# Patient Record
Sex: Male | Born: 1935 | Race: Black or African American | Hispanic: No | Marital: Married | Smoking: Current every day smoker
Health system: Southern US, Community
[De-identification: ages and names within clinical notes are randomized; demographics above are authoritative.]

## PROBLEM LIST (undated history)

## (undated) DIAGNOSIS — I509 Heart failure, unspecified: Secondary | ICD-10-CM

## (undated) DIAGNOSIS — D369 Benign neoplasm, unspecified site: Secondary | ICD-10-CM

## (undated) DIAGNOSIS — I10 Essential (primary) hypertension: Secondary | ICD-10-CM

## (undated) DIAGNOSIS — N529 Male erectile dysfunction, unspecified: Secondary | ICD-10-CM

## (undated) DIAGNOSIS — J449 Chronic obstructive pulmonary disease, unspecified: Secondary | ICD-10-CM

## (undated) DIAGNOSIS — M7051 Other bursitis of knee, right knee: Secondary | ICD-10-CM

## (undated) DIAGNOSIS — J309 Allergic rhinitis, unspecified: Secondary | ICD-10-CM

## (undated) DIAGNOSIS — N400 Enlarged prostate without lower urinary tract symptoms: Secondary | ICD-10-CM

## (undated) DIAGNOSIS — N1832 Chronic kidney disease, stage 3b: Secondary | ICD-10-CM

## (undated) DIAGNOSIS — I5022 Chronic systolic (congestive) heart failure: Secondary | ICD-10-CM

## (undated) DIAGNOSIS — N189 Chronic kidney disease, unspecified: Secondary | ICD-10-CM

## (undated) DIAGNOSIS — I4729 Other ventricular tachycardia: Secondary | ICD-10-CM

## (undated) DIAGNOSIS — K635 Polyp of colon: Secondary | ICD-10-CM

## (undated) DIAGNOSIS — I493 Ventricular premature depolarization: Secondary | ICD-10-CM

## (undated) DIAGNOSIS — E785 Hyperlipidemia, unspecified: Secondary | ICD-10-CM

## (undated) DIAGNOSIS — R0600 Dyspnea, unspecified: Secondary | ICD-10-CM

## (undated) DIAGNOSIS — E119 Type 2 diabetes mellitus without complications: Secondary | ICD-10-CM

## (undated) HISTORY — DX: Chronic kidney disease, unspecified: N18.9

## (undated) HISTORY — PX: INGUINAL HERNIA REPAIR: SUR1180

## (undated) HISTORY — DX: Essential (primary) hypertension: I10

## (undated) HISTORY — DX: Heart failure, unspecified: I50.9

## (undated) HISTORY — DX: Benign prostatic hyperplasia without lower urinary tract symptoms: N40.0

## (undated) HISTORY — DX: Allergic rhinitis, unspecified: J30.9

## (undated) HISTORY — DX: Hyperlipidemia, unspecified: E78.5

## (undated) HISTORY — DX: Chronic kidney disease, stage 3b: N18.32

## (undated) HISTORY — DX: Type 2 diabetes mellitus without complications: E11.9

## (undated) HISTORY — DX: Male erectile dysfunction, unspecified: N52.9

## (undated) HISTORY — DX: Polyp of colon: K63.5

## (undated) HISTORY — DX: Chronic systolic (congestive) heart failure: I50.22

## (undated) HISTORY — DX: Benign neoplasm, unspecified site: D36.9

## (undated) HISTORY — DX: Ventricular premature depolarization: I49.3

## (undated) HISTORY — DX: Other bursitis of knee, right knee: M70.51

## (undated) HISTORY — DX: Chronic obstructive pulmonary disease, unspecified: J44.9

## (undated) HISTORY — DX: Other ventricular tachycardia: I47.29

---

## 2010-12-27 HISTORY — PX: COLONOSCOPY: SHX174

## 2011-09-24 ENCOUNTER — Other Ambulatory Visit (HOSPITAL_COMMUNITY): Payer: Self-pay | Admitting: Internal Medicine

## 2011-09-24 DIAGNOSIS — J449 Chronic obstructive pulmonary disease, unspecified: Secondary | ICD-10-CM

## 2011-10-01 ENCOUNTER — Ambulatory Visit (HOSPITAL_COMMUNITY)
Admission: RE | Admit: 2011-10-01 | Discharge: 2011-10-01 | Disposition: A | Discharge status: Home / Self Care | Payer: Medicare Other | Source: Ambulatory Visit | Attending: Internal Medicine | Admitting: Internal Medicine

## 2011-10-01 DIAGNOSIS — J4489 Other specified chronic obstructive pulmonary disease: Secondary | ICD-10-CM | POA: Insufficient documentation

## 2011-10-01 DIAGNOSIS — J449 Chronic obstructive pulmonary disease, unspecified: Secondary | ICD-10-CM | POA: Insufficient documentation

## 2011-10-01 MED ORDER — ALBUTEROL SULFATE (5 MG/ML) 0.5% IN NEBU
2.5000 mg | INHALATION_SOLUTION | Freq: Once | RESPIRATORY_TRACT | Status: AC
  Administered 2011-10-01: 2.5 mg via RESPIRATORY_TRACT

## 2017-08-30 ENCOUNTER — Encounter: Payer: Self-pay | Admitting: Interventional Cardiology

## 2017-08-30 ENCOUNTER — Other Ambulatory Visit: Payer: Self-pay | Admitting: Internal Medicine

## 2017-08-30 ENCOUNTER — Ambulatory Visit
Admission: RE | Admit: 2017-08-30 | Discharge: 2017-08-30 | Disposition: A | Discharge status: Home / Self Care | Payer: Medicare Other | Source: Ambulatory Visit | Attending: Internal Medicine | Admitting: Internal Medicine

## 2017-08-30 DIAGNOSIS — R05 Cough: Secondary | ICD-10-CM

## 2017-08-30 DIAGNOSIS — R059 Cough, unspecified: Secondary | ICD-10-CM

## 2017-08-31 ENCOUNTER — Telehealth: Payer: Self-pay

## 2017-08-31 NOTE — Telephone Encounter (Signed)
SENT REFERRAL TO SCHEDULING 

## 2017-10-25 ENCOUNTER — Encounter: Payer: Self-pay | Admitting: Interventional Cardiology

## 2017-11-02 DIAGNOSIS — I493 Ventricular premature depolarization: Secondary | ICD-10-CM | POA: Insufficient documentation

## 2017-11-02 DIAGNOSIS — I4510 Unspecified right bundle-branch block: Secondary | ICD-10-CM | POA: Insufficient documentation

## 2017-11-02 DIAGNOSIS — R0602 Shortness of breath: Secondary | ICD-10-CM | POA: Insufficient documentation

## 2017-11-02 NOTE — Progress Notes (Signed)
Cardiology Office Note    Date:  11/03/2017   ID:  Gary Morse, DOB 1934-05-13, MRN [id number]  PCP:  [name], MD  Cardiologist: Sinclair Grooms, MD   Chief Complaint  Patient presents with  . Advice Only    Irregular heartbeat  . Shortness of Breath    History of Present Illness:  Gary Morse is a 83 y.o. male who has a prior history of diastolic heart failure initially noted in 1994, hypertension, and referred now due to irregular heartbeat by Dr. Benita Stabile.  Gary Morse states he has noted episodes of orthopnea in late winter/early spring.  He believes he had the flu.  He can now lie down without significant shortness of breath but does have dyspnea on exertion.  He denies chest pain.  He recently saw Dr. Deforest Hoyles who noted significant irregularity in heart rhythm.  An EKG was performed and revealed PACs as well as PVCs.  He also was noted to have a right bundle branch block.  No old tracings are available to determine chronicity of right bundle branch block.  Past Medical History:  Diagnosis Date  . Adenomatous polyp    POSITIVE, COLON 7/18, COLOGAURD COLON NEGATIVE  . Allergic rhinitis   . BPH (benign prostatic hyperplasia)    MICROSCOPIC HEMATURIA WORKUP IN 2004 WITH UROLOGY NEGATIVE, DR. Karsten Ro  . CHF (congestive heart failure) ([place])    EPISODE IN 1994 SECONDARY TO HYPERTENSION, ECHO 2005 NORMAL LV FUNCTION  . CKD (chronic kidney disease)    STAGE 2 MICROALBUMIN POSITIVE  . Colon polyp    COLON IN 2018  . COPD (chronic obstructive pulmonary disease) ([place])    ON X-RAY, CIGAR USE--PFT IN 2013 NORMAL  . Diabetes type 2, controlled ([place])   . Dyslipidemia   . ED (erectile dysfunction)   . Hypertension   . Patellar bursitis of right knee    PRE-PATELLA BURSITIS WITH INTERMITTENT SWELLING     Past Surgical History:  Procedure Laterality Date  . COLONOSCOPY  12/2010   10/2016  . INGUINAL HERNIA REPAIR Right    1976    Current  Medications: Outpatient Medications Prior to Visit  Medication Sig Dispense Refill  . aspirin EC 81 MG tablet Take 81 mg by mouth daily.    . fluticasone (FLONASE) 50 MCG/ACT nasal spray Place 1 spray into both nostrils daily.    Marland Kitchen glimepiride (AMARYL) 1 MG tablet Take 1 mg by mouth daily with breakfast.    . losartan-hydrochlorothiazide (HYZAAR) 100-12.5 MG tablet Take 1 tablet by mouth daily.    Marland Kitchen NIFEdipine (ADALAT CC) 90 MG 24 hr tablet Take 90 mg by mouth daily.     No facility-administered medications prior to visit.      Allergies:   Metformin and related and Penicillins   Social History   Socioeconomic History  . Marital status: Married    Spouse name: Not on file  . Number of children: 7  . Years of education: Not on file  . Highest education level: Not on file  Occupational History  . Occupation: CARPENTER  Social Needs  . Financial resource strain: Not on file  . Food insecurity:    Worry: Not on file    Inability: Not on file  . Transportation needs:    Medical: Not on file    Non-medical: Not on file  Tobacco Use  . Smoking status: Current Every Day Smoker    Types: Cigars  . Smokeless tobacco: Never Used  Substance and Sexual Activity  . Alcohol use: Yes  . Drug use: Never  . Sexual activity: Not on file  Lifestyle  . Physical activity:    Days per week: Not on file    Minutes per session: Not on file  . Stress: Not on file  Relationships  . Social connections:    Talks on phone: Not on file    Gets together: Not on file    Attends religious service: Not on file    Active member of club or organization: Not on file    Attends meetings of clubs or organizations: Not on file    Relationship status: Not on file  Other Topics Concern  . Not on file  Social History Narrative  . Not on file     Family History:  The patient's family history includes CAD in his father; Congestive Heart Failure in his sister; Congestive Heart Failure (age of onset: 58)  in his mother; Heart attack (age of onset: 18) in his father; Heart failure in his sister; Kidney failure in his brother; Other in his brother; Throat cancer in his brother.   ROS:   Please see the history of present illness.    No episodes of syncope.  Denies chest pain.  No edema. All other systems reviewed and are negative.   PHYSICAL EXAM:   VS:  BP 110/74   Pulse (!) 42   Ht 5\' 8"  (1.727 m)   Wt 196 lb 12.8 oz (89.3 kg)   BMI 29.92 kg/m    GEN: Well nourished, well developed, in no acute distress  HEENT: normal  Neck: no JVD, carotid bruits, or masses Cardiac:Irregular RRR; no murmurs, rubs, or gallops,no edema  Respiratory:  clear to auscultation bilaterally, normal work of breathing GI: soft, nontender, nondistended, + BS MS: no deformity or atrophy  Skin: warm and dry, no rash Neuro:  Alert and Oriented x 3, Strength and sensation are intact Psych: euthymic mood, full affect  Wt Readings from Last 3 Encounters:  11/03/17 196 lb 12.8 oz (89.3 kg)      Studies/Labs Reviewed:   EKG:  EKG May 9 tracing performed by Dr. Deforest Hoyles demonstrates sinus rhythm, PACs, PVC, prominent voltage, left ventricular hypertrophy, and right bundle branch block.  PR interval is also prolonged at 218 seconds.  Recent Labs: No results found for requested labs within last 8760 hours.   Lipid Panel No results found for: CHOL, TRIG, HDL, CHOLHDL, VLDL, LDLCALC, LDLDIRECT  Additional studies/ records that were reviewed today include:  No old cardiac data available.    ASSESSMENT:    1. Chronic diastolic heart failure ([place])   2. Intermittent palpitations   3. RBBB   4. PVC (premature ventricular contraction)   5. SOB (shortness of breath)      PLAN:  In order of problems listed above:  1. Prior history of combined systolic and diastolic heart failure treated with strong antihypertensive regimen with improvement.  Current status unknown but orthopnea earlier this year could have  been related to pulmonary congestion since he had predominantly orthopnea. 2. On auscultation heart rhythm is significantly irregular and relatively fast.  We need to exclude the possibility of atrial fibrillation and also quantitate the burden of premature ventricular contractions. 3. Documented and duration is unknown. 4. Will quantitate the density of premature beats and also exclude atrial fibrillation. 5. Could be a combination of chronic heart failure and COPD.  Still smokes cigars.  States he does not inhale.  Shortness of breath could be pulmonary.  Plan is to perform 2D Doppler echocardiogram to assess LV size and function.  We will also perform a 24-hour Holter monitor to exclude atrial fibrillation and quantitate PVC burden.  Return in 4 to 8 weeks for clinical follow-up.  Encourage patient to stop smoking.  Medication Adjustments/Labs and Tests Ordered: Current medicines are reviewed at length with the patient today.  Concerns regarding medicines are outlined above.  Medication changes, Labs and Tests ordered today are listed in the Patient Instructions below. Patient Instructions  Medication Instructions:  Your physician recommends that you continue on your current medications as directed. Please refer to the Current Medication list given to you today.   Labwork: None   Testing/Procedures: Your physician has recommended that you wear a 24 hour holter monitor. Holter monitors are medical devices that record the heart's electrical activity. Doctors most often use these monitors to diagnose arrhythmias. Arrhythmias are problems with the speed or rhythm of the heartbeat. The monitor is a small, portable device. You can wear one while you do your normal daily activities. This is usually used to diagnose what is causing palpitations/syncope (passing out).  Your physician has requested that you have an echocardiogram. Echocardiography is a painless test that uses sound waves to create  images of your heart. It provides your doctor with information about the size and shape of your heart and how well your heart's chambers and valves are working. This procedure takes approximately one hour. There are no restrictions for this procedure.    Follow-Up: Your physician recommends that you schedule a follow-up appointment in 4 to 6 weeks with Dr. Tamala Julian. (Can have 8/16 at 2:40P)   Any Other Special Instructions Will Be Listed Below (If Applicable).     If you need a refill on your cardiac medications before your next appointment, please call your pharmacy.      Signed, Sinclair Grooms, MD  11/03/2017 11:19 AM    [facility] [address], [place], [place]  [address] Phone: [phone]; Fax: [phone]

## 2017-11-03 ENCOUNTER — Encounter: Payer: Self-pay | Admitting: Interventional Cardiology

## 2017-11-03 ENCOUNTER — Ambulatory Visit: Payer: Medicare Other | Admitting: Interventional Cardiology

## 2017-11-03 VITALS — BP 110/74 | HR 42 | Ht 68.0 in | Wt 196.8 lb

## 2017-11-03 DIAGNOSIS — R002 Palpitations: Secondary | ICD-10-CM

## 2017-11-03 DIAGNOSIS — R0602 Shortness of breath: Secondary | ICD-10-CM | POA: Diagnosis not present

## 2017-11-03 DIAGNOSIS — I4510 Unspecified right bundle-branch block: Secondary | ICD-10-CM

## 2017-11-03 DIAGNOSIS — I493 Ventricular premature depolarization: Secondary | ICD-10-CM | POA: Diagnosis not present

## 2017-11-03 DIAGNOSIS — I5032 Chronic diastolic (congestive) heart failure: Secondary | ICD-10-CM

## 2017-11-03 NOTE — Patient Instructions (Addendum)
Medication Instructions:  Your physician recommends that you continue on your current medications as directed. Please refer to the Current Medication list given to you today.   Labwork: None   Testing/Procedures: Your physician has recommended that you wear a 24 hour holter monitor. Holter monitors are medical devices that record the heart's electrical activity. Doctors most often use these monitors to diagnose arrhythmias. Arrhythmias are problems with the speed or rhythm of the heartbeat. The monitor is a small, portable device. You can wear one while you do your normal daily activities. This is usually used to diagnose what is causing palpitations/syncope (passing out).  Your physician has requested that you have an echocardiogram. Echocardiography is a painless test that uses sound waves to create images of your heart. It provides your doctor with information about the size and shape of your heart and how well your heart's chambers and valves are working. This procedure takes approximately one hour. There are no restrictions for this procedure.    Follow-Up: Your physician recommends that you schedule a follow-up appointment in 4 to 6 weeks with Dr. Tamala Julian. (Can have 8/16 at 2:40P)   Any Other Special Instructions Will Be Listed Below (If Applicable).     If you need a refill on your cardiac medications before your next appointment, please call your pharmacy.

## 2017-11-12 ENCOUNTER — Ambulatory Visit (INDEPENDENT_AMBULATORY_CARE_PROVIDER_SITE_OTHER): Payer: Medicare Other

## 2017-11-12 ENCOUNTER — Other Ambulatory Visit (HOSPITAL_COMMUNITY): Payer: Medicare Other

## 2017-11-12 DIAGNOSIS — R002 Palpitations: Secondary | ICD-10-CM

## 2017-11-15 ENCOUNTER — Ambulatory Visit (HOSPITAL_COMMUNITY): Payer: Medicare Other | Attending: Internal Medicine

## 2017-11-15 ENCOUNTER — Other Ambulatory Visit: Payer: Self-pay

## 2017-11-15 DIAGNOSIS — R0600 Dyspnea, unspecified: Secondary | ICD-10-CM | POA: Diagnosis not present

## 2017-11-15 DIAGNOSIS — E785 Hyperlipidemia, unspecified: Secondary | ICD-10-CM | POA: Diagnosis not present

## 2017-11-15 DIAGNOSIS — I871 Compression of vein: Secondary | ICD-10-CM | POA: Diagnosis not present

## 2017-11-15 DIAGNOSIS — I110 Hypertensive heart disease with heart failure: Secondary | ICD-10-CM | POA: Diagnosis not present

## 2017-11-15 DIAGNOSIS — I499 Cardiac arrhythmia, unspecified: Secondary | ICD-10-CM | POA: Insufficient documentation

## 2017-11-15 DIAGNOSIS — I5032 Chronic diastolic (congestive) heart failure: Secondary | ICD-10-CM | POA: Diagnosis not present

## 2017-11-15 DIAGNOSIS — I4510 Unspecified right bundle-branch block: Secondary | ICD-10-CM | POA: Insufficient documentation

## 2017-11-15 DIAGNOSIS — E119 Type 2 diabetes mellitus without complications: Secondary | ICD-10-CM | POA: Diagnosis not present

## 2017-11-16 ENCOUNTER — Telehealth: Payer: Self-pay | Admitting: *Deleted

## 2017-11-16 MED ORDER — CARVEDILOL 3.125 MG PO TABS: 3.1250 mg | ORAL_TABLET | Freq: Two times a day (BID) | ORAL | 3 refills | Status: DC

## 2017-11-16 MED ORDER — FUROSEMIDE 20 MG PO TABS: 20.0000 mg | ORAL_TABLET | Freq: Every day | ORAL | 3 refills | Status: DC

## 2017-11-16 MED ORDER — SACUBITRIL-VALSARTAN 24-26 MG PO TABS: 1.0000 | ORAL_TABLET | Freq: Two times a day (BID) | ORAL | 11 refills | Status: DC

## 2017-11-16 NOTE — Telephone Encounter (Signed)
-----   Message from Belva Crome, MD sent at 11/15/2017  8:53 PM EDT ----- Let the patient know heart is very weak. Need to make adjustment in meds. Stop losartan hctz, start Entresto 24/26 mg BID 24 hours later. Start Carvedilol 3.125 mg BID. Stop nifedipine. Start furosemide 20 mg daily after Losartan Hct stopped. Needs OV next week with BMP after med change. A copy will be sent to [name], MD

## 2017-11-16 NOTE — Telephone Encounter (Signed)
Spoke with pt and went over results and recommendations per Dr. Tamala Julian. Pt asked that I speak with wife about medication changes.  Spoke with wife and went over results and recommendations as well.  Scheduled pt to come in 7/31 for f/u visit and labs.  Wife verbalized understanding and was in agreement with this plan.

## 2017-11-23 ENCOUNTER — Telehealth: Payer: Self-pay | Admitting: Interventional Cardiology

## 2017-11-23 NOTE — Telephone Encounter (Signed)
Spoke with wife, DPR on file.  Advised her pt was suppose to start the Furosemide last week when we spoke.  Wife unsure if pt started or not.  Advised wife to have pt keep appt tomorrow and bring pill bottles with him to appt.  Wife verbalized understanding and was appreciative for call back.

## 2017-11-23 NOTE — Telephone Encounter (Signed)
New message   Patient wants to know when he should start taking Lasix.

## 2017-11-24 ENCOUNTER — Encounter (INDEPENDENT_AMBULATORY_CARE_PROVIDER_SITE_OTHER): Payer: Self-pay

## 2017-11-24 ENCOUNTER — Inpatient Hospital Stay (HOSPITAL_COMMUNITY)
Admission: AD | Admit: 2017-11-24 | Discharge: 2017-11-26 | DRG: 291 | Disposition: A | Discharge status: Home Health | Payer: Medicare Other | Source: Ambulatory Visit | Attending: Interventional Cardiology | Admitting: Interventional Cardiology

## 2017-11-24 ENCOUNTER — Ambulatory Visit: Payer: Medicare Other | Admitting: Interventional Cardiology

## 2017-11-24 ENCOUNTER — Encounter: Payer: Self-pay | Admitting: Interventional Cardiology

## 2017-11-24 ENCOUNTER — Encounter (HOSPITAL_COMMUNITY): Payer: Self-pay | Admitting: General Practice

## 2017-11-24 ENCOUNTER — Other Ambulatory Visit: Payer: Self-pay | Admitting: Interventional Cardiology

## 2017-11-24 ENCOUNTER — Other Ambulatory Visit: Payer: Self-pay

## 2017-11-24 VITALS — BP 170/100 | HR 60 | Ht 68.0 in | Wt 202.0 lb

## 2017-11-24 DIAGNOSIS — R0602 Shortness of breath: Secondary | ICD-10-CM

## 2017-11-24 DIAGNOSIS — Z880 Allergy status to penicillin: Secondary | ICD-10-CM

## 2017-11-24 DIAGNOSIS — J449 Chronic obstructive pulmonary disease, unspecified: Secondary | ICD-10-CM | POA: Diagnosis not present

## 2017-11-24 DIAGNOSIS — I110 Hypertensive heart disease with heart failure: Secondary | ICD-10-CM

## 2017-11-24 DIAGNOSIS — R402360 Coma scale, best motor response, obeys commands, unspecified time: Secondary | ICD-10-CM | POA: Diagnosis not present

## 2017-11-24 DIAGNOSIS — Z888 Allergy status to other drugs, medicaments and biological substances status: Secondary | ICD-10-CM | POA: Diagnosis not present

## 2017-11-24 DIAGNOSIS — I452 Bifascicular block: Secondary | ICD-10-CM | POA: Diagnosis not present

## 2017-11-24 DIAGNOSIS — I130 Hypertensive heart and chronic kidney disease with heart failure and stage 1 through stage 4 chronic kidney disease, or unspecified chronic kidney disease: Secondary | ICD-10-CM | POA: Diagnosis present

## 2017-11-24 DIAGNOSIS — Z86010 Personal history of colonic polyps: Secondary | ICD-10-CM | POA: Diagnosis not present

## 2017-11-24 DIAGNOSIS — N400 Enlarged prostate without lower urinary tract symptoms: Secondary | ICD-10-CM | POA: Diagnosis present

## 2017-11-24 DIAGNOSIS — Z7984 Long term (current) use of oral hypoglycemic drugs: Secondary | ICD-10-CM | POA: Diagnosis not present

## 2017-11-24 DIAGNOSIS — I493 Ventricular premature depolarization: Secondary | ICD-10-CM | POA: Diagnosis present

## 2017-11-24 DIAGNOSIS — Z7982 Long term (current) use of aspirin: Secondary | ICD-10-CM

## 2017-11-24 DIAGNOSIS — Z79899 Other long term (current) drug therapy: Secondary | ICD-10-CM | POA: Diagnosis not present

## 2017-11-24 DIAGNOSIS — R402250 Coma scale, best verbal response, oriented, unspecified time: Secondary | ICD-10-CM | POA: Diagnosis not present

## 2017-11-24 DIAGNOSIS — I5022 Chronic systolic (congestive) heart failure: Secondary | ICD-10-CM | POA: Insufficient documentation

## 2017-11-24 DIAGNOSIS — I5023 Acute on chronic systolic (congestive) heart failure: Secondary | ICD-10-CM | POA: Diagnosis present

## 2017-11-24 DIAGNOSIS — F17290 Nicotine dependence, other tobacco product, uncomplicated: Secondary | ICD-10-CM | POA: Diagnosis not present

## 2017-11-24 DIAGNOSIS — E1122 Type 2 diabetes mellitus with diabetic chronic kidney disease: Secondary | ICD-10-CM | POA: Diagnosis present

## 2017-11-24 DIAGNOSIS — I5043 Acute on chronic combined systolic (congestive) and diastolic (congestive) heart failure: Secondary | ICD-10-CM | POA: Diagnosis present

## 2017-11-24 DIAGNOSIS — N182 Chronic kidney disease, stage 2 (mild): Secondary | ICD-10-CM | POA: Diagnosis present

## 2017-11-24 DIAGNOSIS — R402140 Coma scale, eyes open, spontaneous, unspecified time: Secondary | ICD-10-CM | POA: Diagnosis present

## 2017-11-24 DIAGNOSIS — R0600 Dyspnea, unspecified: Secondary | ICD-10-CM

## 2017-11-24 DIAGNOSIS — I4510 Unspecified right bundle-branch block: Secondary | ICD-10-CM

## 2017-11-24 HISTORY — DX: Dyspnea, unspecified: R06.00

## 2017-11-24 LAB — CBC WITH DIFFERENTIAL/PLATELET
Abs Immature Granulocytes: 0 10*3/uL (ref 0.0–0.1)
Basophils Absolute: 0 10*3/uL (ref 0.0–0.1)
Basophils Relative: 1 %
EOS PCT: 2 %
Eosinophils Absolute: 0.1 10*3/uL (ref 0.0–0.7)
HCT: 46.4 % (ref 39.0–52.0)
Hemoglobin: 15.1 g/dL (ref 13.0–17.0)
Immature Granulocytes: 0 %
Lymphocytes Relative: 36 %
Lymphs Abs: 1.8 10*3/uL (ref 0.7–4.0)
MCH: 26.8 pg (ref 26.0–34.0)
MCHC: 32.5 g/dL (ref 30.0–36.0)
MCV: 82.3 fL (ref 78.0–100.0)
MONO ABS: 0.6 10*3/uL (ref 0.1–1.0)
Monocytes Relative: 12 %
Neutro Abs: 2.5 10*3/uL (ref 1.7–7.7)
Neutrophils Relative %: 49 %
Platelets: 249 10*3/uL (ref 150–400)
RBC: 5.64 MIL/uL (ref 4.22–5.81)
RDW: 15.9 % — ABNORMAL HIGH (ref 11.5–15.5)
WBC: 5 10*3/uL (ref 4.0–10.5)

## 2017-11-24 LAB — COMPREHENSIVE METABOLIC PANEL
ALK PHOS: 68 U/L (ref 38–126)
ALT: 51 U/L — ABNORMAL HIGH (ref 0–44)
AST: 46 U/L — ABNORMAL HIGH (ref 15–41)
Albumin: 3.2 g/dL — ABNORMAL LOW (ref 3.5–5.0)
Anion gap: 9 (ref 5–15)
BUN: 11 mg/dL (ref 8–23)
CALCIUM: 8.5 mg/dL — AB (ref 8.9–10.3)
CO2: 28 mmol/L (ref 22–32)
Chloride: 104 mmol/L (ref 98–111)
Creatinine, Ser: 1.36 mg/dL — ABNORMAL HIGH (ref 0.61–1.24)
GFR calc non Af Amer: 48 mL/min — ABNORMAL LOW (ref >60)
GFR, EST AFRICAN AMERICAN: 55 mL/min — AB (ref >60)
Glucose, Bld: 149 mg/dL — ABNORMAL HIGH (ref 70–99)
POTASSIUM: 3.4 mmol/L — AB (ref 3.5–5.1)
Sodium: 141 mmol/L (ref 135–145)
Total Bilirubin: 0.7 mg/dL (ref 0.3–1.2)
Total Protein: 6.6 g/dL (ref 6.5–8.1)

## 2017-11-24 LAB — TSH: TSH: 1.37 u[IU]/mL (ref 0.350–4.500)

## 2017-11-24 LAB — GLUCOSE, CAPILLARY
GLUCOSE-CAPILLARY: 117 mg/dL — AB (ref 70–99)
Glucose-Capillary: 171 mg/dL — ABNORMAL HIGH (ref 70–99)
Glucose-Capillary: 115 mg/dL — ABNORMAL HIGH (ref 70–99)

## 2017-11-24 LAB — PROTIME-INR
INR: 0.98
Prothrombin Time: 12.9 seconds (ref 11.4–15.2)

## 2017-11-24 LAB — BRAIN NATRIURETIC PEPTIDE: B Natriuretic Peptide: 1832.7 pg/mL — ABNORMAL HIGH (ref 0.0–100.0)

## 2017-11-24 MED ORDER — ASPIRIN EC 81 MG PO TBEC
81.0000 mg | DELAYED_RELEASE_TABLET | Freq: Every day | ORAL | Status: DC
  Administered 2017-11-25 – 2017-11-26 (×2): 81 mg via ORAL
  Filled 2017-11-24 (×2): qty 1

## 2017-11-24 MED ORDER — FUROSEMIDE 10 MG/ML IJ SOLN
40.0000 mg | Freq: Two times a day (BID) | INTRAMUSCULAR | Status: AC
  Administered 2017-11-24 – 2017-11-26 (×4): 40 mg via INTRAVENOUS
  Filled 2017-11-24 (×4): qty 4

## 2017-11-24 MED ORDER — SODIUM CHLORIDE 0.9% FLUSH
3.0000 mL | Freq: Two times a day (BID) | INTRAVENOUS | Status: DC
  Administered 2017-11-24 – 2017-11-26 (×5): 3 mL via INTRAVENOUS

## 2017-11-24 MED ORDER — SACUBITRIL-VALSARTAN 24-26 MG PO TABS
1.0000 | ORAL_TABLET | Freq: Two times a day (BID) | ORAL | Status: DC
  Administered 2017-11-24 – 2017-11-26 (×4): 1 via ORAL
  Filled 2017-11-24 (×5): qty 1

## 2017-11-24 MED ORDER — ENOXAPARIN SODIUM 40 MG/0.4ML ~~LOC~~ SOLN
40.0000 mg | SUBCUTANEOUS | Status: DC
  Administered 2017-11-24 – 2017-11-25 (×2): 40 mg via SUBCUTANEOUS
  Filled 2017-11-24 (×2): qty 0.4

## 2017-11-24 MED ORDER — CARVEDILOL 3.125 MG PO TABS
3.1250 mg | ORAL_TABLET | Freq: Two times a day (BID) | ORAL | Status: DC
  Administered 2017-11-25: 3.125 mg via ORAL
  Filled 2017-11-24 (×3): qty 1

## 2017-11-24 MED ORDER — ONDANSETRON HCL 4 MG/2ML IJ SOLN: 4.0000 mg | Freq: Four times a day (QID) | INTRAMUSCULAR | Status: DC | PRN

## 2017-11-24 MED ORDER — ACETAMINOPHEN 325 MG PO TABS: 650.0000 mg | ORAL_TABLET | ORAL | Status: DC | PRN

## 2017-11-24 MED ORDER — SODIUM CHLORIDE 0.9 % IV SOLN: 250.0000 mL | INTRAVENOUS | Status: DC | PRN

## 2017-11-24 MED ORDER — POTASSIUM CHLORIDE CRYS ER 20 MEQ PO TBCR
20.0000 meq | EXTENDED_RELEASE_TABLET | Freq: Every day | ORAL | Status: DC
  Administered 2017-11-24 – 2017-11-26 (×3): 20 meq via ORAL
  Filled 2017-11-24 (×3): qty 1

## 2017-11-24 MED ORDER — SODIUM CHLORIDE 0.9% FLUSH: 3.0000 mL | INTRAVENOUS | Status: DC | PRN

## 2017-11-24 NOTE — Plan of Care (Signed)
  Problem: Education: Goal: Knowledge of General Education information will improve Description Including pain rating scale, medication(s)/side effects and non-pharmacologic comfort measures Outcome: Progressing   

## 2017-11-24 NOTE — H&P (Signed)
Cardiology admission history and physical   Date:  11/24/2017   ID:  Gary Morse, DOB 1935/04/03, MRN [id number]  PCP:  [name], MD  Cardiologist:  No primary care provider on file.   Referring MD: [name], MD   Chief Complaint  Patient presents with  . Congestive Heart Failure    Acute decompensation    History of Present Illness:    Gary Morse is a 83 y.o. male with a hx of prior chronic systolic and diastolic heart failure felt secondary to hypertension, frequent PVCs, and recently noted decline in LV function EF less than 25-30% July 2019.  Possible correlation with frequent PVCs either primary or secondarily related to LV dysfunction.  He has a ejection fraction of 25 to 30% by recent echo approximately 10 days ago.  Echo was done because of a history of "cold and congestion in chest" since January.  Medications were adjusted by discontinuing nifedipine and losartan HCTZ (100/12.5 mg).  He was started on furosemide 20 mg/day, Entresto 24/26 mg twice daily, and carvedilol 3.125 mg p.o. twice daily 1 week ago.  For the past 72 hours the patient has noted orthopnea/PND.  He awakened at 3 AM today and did not go back to sleep because of shortness of breath.  He feels that furosemide is not causing him to eliminate any fluid.  Furosemide this a.m. led to significant diuresis and states breathing slightly better.  He is accompanied by his son.  He is audibly short of breath although states that he feels better since taking 20 mg of furosemide this morning.  I have recommended that he be admitted to the hospital for acute exacerbation of systolic heart failure to more expediently control his blood pressure and establish diuresis.  Also an issue is etiology for worsening LV function.  He has frequent PVCs nearly 20% of all electrical activity.  May need to consider whether or not PVCs are contributing to LV systolic dysfunction.   Past Medical History:  Diagnosis Date    . Adenomatous polyp    POSITIVE, COLON 7/18, COLOGAURD COLON NEGATIVE  . Allergic rhinitis   . BPH (benign prostatic hyperplasia)    MICROSCOPIC HEMATURIA WORKUP IN 2004 WITH UROLOGY NEGATIVE, DR. Karsten Ro  . CHF (congestive heart failure) ([place])    EPISODE IN 1994 SECONDARY TO HYPERTENSION, ECHO 2005 NORMAL LV FUNCTION  . CKD (chronic kidney disease)    STAGE 2 MICROALBUMIN POSITIVE  . Colon polyp    COLON IN 2018  . COPD (chronic obstructive pulmonary disease) ([place])    ON X-RAY, CIGAR USE--PFT IN 2013 NORMAL  . Diabetes type 2, controlled ([place])   . Dyslipidemia   . ED (erectile dysfunction)   . Hypertension   . Patellar bursitis of right knee    PRE-PATELLA BURSITIS WITH INTERMITTENT SWELLING     Past Surgical History:  Procedure Laterality Date  . COLONOSCOPY  12/2010   10/2016  . INGUINAL HERNIA REPAIR Right    1976    Current Medications: Current Meds  Medication Sig  . aspirin EC 81 MG tablet Take 81 mg by mouth daily.  . carvedilol (COREG) 3.125 MG tablet Take 1 tablet (3.125 mg total) by mouth 2 (two) times daily.  . furosemide (LASIX) 20 MG tablet Take 1 tablet (20 mg total) by mouth daily.  Marland Kitchen glimepiride (AMARYL) 1 MG tablet Take 1 mg by mouth daily with breakfast.  . sacubitril-valsartan (ENTRESTO) 24-26 MG Take 1 tablet by mouth 2 (two) times daily.  Allergies:   Metformin and related and Penicillins   Social History   Socioeconomic History  . Marital status: Married    Spouse name: Not on file  . Number of children: 7  . Years of education: Not on file  . Highest education level: Not on file  Occupational History  . Occupation: CARPENTER  Social Needs  . Financial resource strain: Not on file  . Food insecurity:    Worry: Not on file    Inability: Not on file  . Transportation needs:    Medical: Not on file    Non-medical: Not on file  Tobacco Use  . Smoking status: Current Every Day Smoker    Types: Cigars  . Smokeless tobacco: Never Used   Substance and Sexual Activity  . Alcohol use: Yes  . Drug use: Never  . Sexual activity: Not on file  Lifestyle  . Physical activity:    Days per week: Not on file    Minutes per session: Not on file  . Stress: Not on file  Relationships  . Social connections:    Talks on phone: Not on file    Gets together: Not on file    Attends religious service: Not on file    Active member of club or organization: Not on file    Attends meetings of clubs or organizations: Not on file    Relationship status: Not on file  Other Topics Concern  . Not on file  Social History Narrative  . Not on file     Family History: The patient's family history includes CAD in his father; Congestive Heart Failure in his sister; Congestive Heart Failure (age of onset: 28) in his mother; Heart attack (age of onset: 62) in his father; Heart failure in his sister; Kidney failure in his brother; Other in his brother; Throat cancer in his brother. There is no history of Colon cancer, Colon polyps, or Liver disease.  ROS:   Please see the history of present illness.    Rash and itching left lower extremity.  Left lower extremity edema greater than right lower extremity edema.  Difficulty with balance, but no tobacco or alcohol use.  All other systems reviewed and are negative.  EKGs/Labs/Other Studies Reviewed:    The following studies were reviewed today: 2D Doppler echocardiogram Study Conclusions  - Left ventricle: The cavity size was normal. Wall thickness was   increased in a pattern of moderate LVH. Systolic function was   severely reduced. The estimated ejection fraction was in the   range of 25% to 30%. Diffuse hypokinesis. Doppler parameters are   consistent with pseudonormal left ventricular relaxation (grade 2   diastolic dysfunction). The E/e&' ratio is >15, suggesting   elevated LV filling pressure. - Mitral valve: Mildly thickened leaflets . There was trivial   regurgitation. - Left atrium:  Severely dilated. - Inferior vena cava: The vessel was dilated. The respirophasic   diameter changes were blunted (< 50%), consistent with elevated   central venous pressure.  Impressions:  - LVEF 25-30%, moderate LVH, severe global hypokinesis, grade 2 DD,   elevated LV filling pressure, severe LAE, dilated IVC.  24-hour Holter monitor:  Study Highlights     NSR  Rare PAC's  Frequent PVC's, with up to 5 beat runs. Burden of PVC's 18%  No atrial fib or sustained arrhythmias  Occasional blocked PAC's   HEART RATE EPISODES Minimum HR: 54 BPM at 6:26:14 AM Maximum HR: 130 BPM at 10:31:33 PM  Average HR: 90 BPM     EKG:  EKG is not  ordered today.  The ekg ordered Aug 30, 2017 demonstrates sinus rhythm/tachycardia, right bundle, left posterior hemiblock and prominent voltage.  Recent Labs: No results found for requested labs within last 8760 hours.  Recent Lipid Panel No results found for: CHOL, TRIG, HDL, CHOLHDL, VLDL, LDLCALC, LDLDIRECT  Physical Exam:    VS:  BP (!) 170/100   Pulse 60   Ht 5\' 8"  (1.727 m)   Wt 202 lb (91.6 kg)   BMI 30.71 kg/m     Wt Readings from Last 3 Encounters:  11/24/17 202 lb (91.6 kg)  11/03/17 196 lb 12.8 oz (89.3 kg)     GEN: Appears younger than stated age well nourished, well developed in no acute distress HEENT: Normal NECK: No JVD. LYMPHATICS: No lymphadenopathy CARDIAC: RRR, apical systolic murmur, and possibly an S3 summation gallop, 1+ bilateral ankle to mid shin edema. VASCULAR: 2+ radial and posterior tibial bilateral pulses.  No bruits. RESPIRATORY: Basilar rales.  No audible wheezing.   ABDOMEN: Soft, non-tender, non-distended, No pulsatile mass, MUSCULOSKELETAL: No deformity  SKIN: Warm and dry NEUROLOGIC:  Alert and oriented x 3 PSYCHIATRIC:  Normal affect   ASSESSMENT:    1. Acute on chronic systolic heart failure, NYHA class 3 ([place])   2. Frequent PVCs   3. SOB (shortness of breath)   4. RBBB    PLAN:     In order of problems listed above:  1. Patient has acute on chronic systolic heart failure exacerbation in the setting of LVEF 25 to 30%, and recent adjustment in medical regimen to achieve guideline directed therapy.  He is currently on Entresto 24/26 mg twice daily, carvedilol 3.125 mg twice daily of furosemide 20 mg/day.  He is clearly not diuresing and his blood pressure is extremely high.  When Entresto was started both nifedipine and losartan HCT were discontinued 10 days ago.  Current therapy is not covering the needed diuresis or keeping the blood pressure control.  Plan will be to admit diuresis, tracking of kidney function, optimization of Entresto and beta-blocker therapy rather than further attempts at outpatient management relative frailty. 2. With frequent PVCs may be inhibiting to systolic dysfunction.  Will need to further investigate the relationship and whether this is primary versus secondary ectopic activity related to systolic dysfunction. 3. Shortness of breath is related to heart failure.  He still smokes and there is a toxic component of COPD. 4. He has right bundle and left posterior hemiblock.  Will need to be careful with beta-blocker therapy with reference to both AV conduction abnormality. 5.  The patient will go home and be admitted to a telemetry bed on 3 E. when available over the next 6 hours.  I have advised him to take 40 mg of Lasix when he arrives at home this morning.  Medication Adjustments/Labs and Tests Ordered: Current medicines are reviewed at length with the patient today.  Concerns regarding medicines are outlined above.  No orders of the defined types were placed in this encounter.  No orders of the defined types were placed in this encounter.   Patient Instructions  Medication Instructions:  Your physician recommends that you continue on your current medications as directed. Please refer to the Current Medication list given to you  today.  Labwork: None  Testing/Procedures: None  Follow-Up: Keep current follow up with Dr. Tamala Julian on 8/16  Any Other Special Instructions Will Be Listed Below (If  Applicable).  [facility] are being admitted to [facility].  They will call you once the bed is available.  You will need to report to the [facility], [employer] and check in with admitting once they call.     If you need a refill on your cardiac medications before your next appointment, please call your pharmacy.      Signed, Gary Grooms, MD  11/24/2017 11:49 AM    [place]

## 2017-11-24 NOTE — Progress Notes (Signed)
Cardiology admission history and physical   Date:  11/24/2017   ID:  Gary Morse, DOB [birth year], MRN [id number]  PCP:  [name], MD  Cardiologist:  No primary care provider on file.   Referring MD: [name], MD   Chief Complaint  Patient presents with  . Congestive Heart Failure    Acute decompensation    History of Present Illness:    Gary Morse is a 83 y.o. male with a hx of prior chronic systolic and diastolic heart failure felt secondary to hypertension, frequent PVCs, and recently noted decline in LV function EF less than 25-30% July 2019.  Possible correlation with frequent PVCs either primary or secondarily related to LV dysfunction.  He has a ejection fraction of 25 to 30% by recent echo approximately 10 days ago.  Echo was done because of a history of "cold and congestion in chest" since January.  Medications were adjusted by discontinuing nifedipine and losartan HCTZ (100/12.5 mg).  He was started on furosemide 20 mg/day, Entresto 24/26 mg twice daily, and carvedilol 3.125 mg p.o. twice daily 1 week ago.  For the past 72 hours the patient has noted orthopnea/PND.  He awakened at 3 AM today and did not go back to sleep because of shortness of breath.  He feels that furosemide is not causing him to eliminate any fluid.  Furosemide this a.m. led to significant diuresis and states breathing slightly better.  He is accompanied by his son.  He is audibly short of breath although states that he feels better since taking 20 mg of furosemide this morning.  I have recommended that he be admitted to the hospital for acute exacerbation of systolic heart failure to more expediently control his blood pressure and establish diuresis.  Also an issue is etiology for worsening LV function.  He has frequent PVCs nearly 20% of all electrical activity.  May need to consider whether or not PVCs are contributing to LV systolic dysfunction.   Past Medical History:  Diagnosis Date    . Adenomatous polyp    POSITIVE, COLON 7/18, [place] COLON NEGATIVE  . Allergic rhinitis   . BPH (benign prostatic hyperplasia)    MICROSCOPIC HEMATURIA WORKUP IN 2004 WITH UROLOGY NEGATIVE, DR. Karsten Ro  . CHF (congestive heart failure) ([place])    EPISODE IN 1994 SECONDARY TO HYPERTENSION, ECHO 2005 NORMAL LV FUNCTION  . CKD (chronic kidney disease)    STAGE 2 MICROALBUMIN POSITIVE  . Colon polyp    COLON IN 2018  . COPD (chronic obstructive pulmonary disease) ([place])    ON X-RAY, CIGAR USE--PFT IN 2013 NORMAL  . Diabetes type 2, controlled ([place])   . Dyslipidemia   . ED (erectile dysfunction)   . Hypertension   . Patellar bursitis of right knee    PRE-PATELLA BURSITIS WITH INTERMITTENT SWELLING     Past Surgical History:  Procedure Laterality Date  . COLONOSCOPY  12/2010   10/2016  . INGUINAL HERNIA REPAIR Right    1976    Current Medications: Current Meds  Medication Sig  . aspirin EC 81 MG tablet Take 81 mg by mouth daily.  . carvedilol (COREG) 3.125 MG tablet Take 1 tablet (3.125 mg total) by mouth 2 (two) times daily.  . furosemide (LASIX) 20 MG tablet Take 1 tablet (20 mg total) by mouth daily.  Marland Kitchen glimepiride (AMARYL) 1 MG tablet Take 1 mg by mouth daily with breakfast.  . sacubitril-valsartan (ENTRESTO) 24-26 MG Take 1 tablet by mouth 2 (two) times daily.  Allergies:   Metformin and related and Penicillins   Social History   Socioeconomic History  . Marital status: Married    Spouse name: Not on file  . Number of children: 7  . Years of education: Not on file  . Highest education level: Not on file  Occupational History  . Occupation: CARPENTER  Social Needs  . Financial resource strain: Not on file  . Food insecurity:    Worry: Not on file    Inability: Not on file  . Transportation needs:    Medical: Not on file    Non-medical: Not on file  Tobacco Use  . Smoking status: Current Every Day Smoker    Types: Cigars  . Smokeless tobacco: Never Used   Substance and Sexual Activity  . Alcohol use: Yes  . Drug use: Never  . Sexual activity: Not on file  Lifestyle  . Physical activity:    Days per week: Not on file    Minutes per session: Not on file  . Stress: Not on file  Relationships  . Social connections:    Talks on phone: Not on file    Gets together: Not on file    Attends religious service: Not on file    Active member of club or organization: Not on file    Attends meetings of clubs or organizations: Not on file    Relationship status: Not on file  Other Topics Concern  . Not on file  Social History Narrative  . Not on file     Family History: The patient's family history includes CAD in his father; Congestive Heart Failure in his sister; Congestive Heart Failure (age of onset: 84) in his mother; Heart attack (age of onset: 18) in his father; Heart failure in his sister; Kidney failure in his brother; Other in his brother; Throat cancer in his brother. There is no history of Colon cancer, Colon polyps, or Liver disease.  ROS:   Please see the history of present illness.    Rash and itching left lower extremity.  Left lower extremity edema greater than right lower extremity edema.  Difficulty with balance, but no tobacco or alcohol use.  All other systems reviewed and are negative.  EKGs/Labs/Other Studies Reviewed:    The following studies were reviewed today: 2D Doppler echocardiogram Study Conclusions  - Left ventricle: The cavity size was normal. Wall thickness was   increased in a pattern of moderate LVH. Systolic function was   severely reduced. The estimated ejection fraction was in the   range of 25% to 30%. Diffuse hypokinesis. Doppler parameters are   consistent with pseudonormal left ventricular relaxation (grade 2   diastolic dysfunction). The E/e&' ratio is >15, suggesting   elevated LV filling pressure. - Mitral valve: Mildly thickened leaflets . There was trivial   regurgitation. - Left atrium:  Severely dilated. - Inferior vena cava: The vessel was dilated. The respirophasic   diameter changes were blunted (< 50%), consistent with elevated   central venous pressure.  Impressions:  - LVEF 25-30%, moderate LVH, severe global hypokinesis, grade 2 DD,   elevated LV filling pressure, severe LAE, dilated IVC.  24-hour Holter monitor:  Study Highlights     NSR  Rare PAC's  Frequent PVC's, with up to 5 beat runs. Burden of PVC's 18%  No atrial fib or sustained arrhythmias  Occasional blocked PAC's   HEART RATE EPISODES Minimum HR: 54 BPM at 6:26:14 AM Maximum HR: 130 BPM at 10:31:33 PM  Average HR: 90 BPM     EKG:  EKG is not  ordered today.  The ekg ordered Aug 30, 2017 demonstrates sinus rhythm/tachycardia, right bundle, left posterior hemiblock and prominent voltage.  Recent Labs: No results found for requested labs within last 8760 hours.  Recent Lipid Panel No results found for: CHOL, TRIG, HDL, CHOLHDL, VLDL, LDLCALC, LDLDIRECT  Physical Exam:    VS:  BP (!) 170/100   Pulse 60   Ht 5\' 8"  (1.727 m)   Wt 202 lb (91.6 kg)   BMI 30.71 kg/m     Wt Readings from Last 3 Encounters:  11/24/17 202 lb (91.6 kg)  11/03/17 196 lb 12.8 oz (89.3 kg)     GEN: Appears younger than stated age well nourished, well developed in no acute distress HEENT: Normal NECK: No JVD. LYMPHATICS: No lymphadenopathy CARDIAC: RRR, apical systolic murmur, and possibly an S3 summation gallop, 1+ bilateral ankle to mid shin edema. VASCULAR: 2+ radial and posterior tibial bilateral pulses.  No bruits. RESPIRATORY: Basilar rales.  No audible wheezing.   ABDOMEN: Soft, non-tender, non-distended, No pulsatile mass, MUSCULOSKELETAL: No deformity  SKIN: Warm and dry NEUROLOGIC:  Alert and oriented x 3 PSYCHIATRIC:  Normal affect   ASSESSMENT:    1. Acute on chronic systolic heart failure, NYHA class 3 ([place])   2. Frequent PVCs   3. SOB (shortness of breath)   4. RBBB    PLAN:     In order of problems listed above:  1. Patient has acute on chronic systolic heart failure exacerbation in the setting of LVEF 25 to 30%, and recent adjustment in medical regimen to achieve guideline directed therapy.  He is currently on Entresto 24/26 mg twice daily, carvedilol 3.125 mg twice daily of furosemide 20 mg/day.  He is clearly not diuresing and his blood pressure is extremely high.  When Entresto was started both nifedipine and losartan HCT were discontinued 10 days ago.  Current therapy is not covering the needed diuresis or keeping the blood pressure control.  Plan will be to admit diuresis, tracking of kidney function, optimization of Entresto and beta-blocker therapy rather than further attempts at outpatient management relative frailty. 2. With frequent PVCs may be inhibiting to systolic dysfunction.  Will need to further investigate the relationship and whether this is primary versus secondary ectopic activity related to systolic dysfunction. 3. Shortness of breath is related to heart failure.  He still smokes and there is a toxic component of COPD. 4. He has right bundle and left posterior hemiblock.  Will need to be careful with beta-blocker therapy with reference to both AV conduction abnormality. 5.  The patient will go home and be admitted to a telemetry bed on 3 E. when available over the next 6 hours.  I have advised him to take 40 mg of Lasix when he arrives at home this morning.  Medication Adjustments/Labs and Tests Ordered: Current medicines are reviewed at length with the patient today.  Concerns regarding medicines are outlined above.  No orders of the defined types were placed in this encounter.  No orders of the defined types were placed in this encounter.   Patient Instructions  Medication Instructions:  Your physician recommends that you continue on your current medications as directed. Please refer to the Current Medication list given to you  today.  Labwork: None  Testing/Procedures: None  Follow-Up: Keep current follow up with Dr. Tamala Julian on 8/16  Any Other Special Instructions Will Be Listed Below (If  Applicable).  You are being admitted to [facility].  They will call you once the bed is available.  You will need to report to the [facility], [employer] and check in with admitting once they call.     If you need a refill on your cardiac medications before your next appointment, please call your pharmacy.      Signed, Sinclair Grooms, MD  11/24/2017 11:49 AM    [place]

## 2017-11-24 NOTE — Patient Instructions (Signed)
Medication Instructions:  Your physician recommends that you continue on your current medications as directed. Please refer to the Current Medication list given to you today.  Labwork: None  Testing/Procedures: None  Follow-Up: Keep current follow up with Dr. Tamala Julian on 8/16  Any Other Special Instructions Will Be Listed Below (If Applicable).  You are being admitted to [facility].  They will call you once the bed is available.  You will need to report to the [facility], [employer] and check in with admitting once they call.     If you need a refill on your cardiac medications before your next appointment, please call your pharmacy.

## 2017-11-25 ENCOUNTER — Inpatient Hospital Stay (HOSPITAL_COMMUNITY): Payer: Medicare Other

## 2017-11-25 DIAGNOSIS — R402140 Coma scale, eyes open, spontaneous, unspecified time: Secondary | ICD-10-CM | POA: Diagnosis not present

## 2017-11-25 DIAGNOSIS — Z79899 Other long term (current) drug therapy: Secondary | ICD-10-CM | POA: Diagnosis not present

## 2017-11-25 DIAGNOSIS — Z7984 Long term (current) use of oral hypoglycemic drugs: Secondary | ICD-10-CM | POA: Diagnosis not present

## 2017-11-25 DIAGNOSIS — I5023 Acute on chronic systolic (congestive) heart failure: Secondary | ICD-10-CM | POA: Diagnosis not present

## 2017-11-25 DIAGNOSIS — R402250 Coma scale, best verbal response, oriented, unspecified time: Secondary | ICD-10-CM | POA: Diagnosis not present

## 2017-11-25 DIAGNOSIS — I452 Bifascicular block: Secondary | ICD-10-CM | POA: Diagnosis not present

## 2017-11-25 DIAGNOSIS — F17290 Nicotine dependence, other tobacco product, uncomplicated: Secondary | ICD-10-CM | POA: Diagnosis not present

## 2017-11-25 DIAGNOSIS — I493 Ventricular premature depolarization: Secondary | ICD-10-CM | POA: Diagnosis not present

## 2017-11-25 DIAGNOSIS — Z888 Allergy status to other drugs, medicaments and biological substances status: Secondary | ICD-10-CM | POA: Diagnosis not present

## 2017-11-25 DIAGNOSIS — N400 Enlarged prostate without lower urinary tract symptoms: Secondary | ICD-10-CM | POA: Diagnosis not present

## 2017-11-25 DIAGNOSIS — N182 Chronic kidney disease, stage 2 (mild): Secondary | ICD-10-CM | POA: Diagnosis not present

## 2017-11-25 DIAGNOSIS — I130 Hypertensive heart and chronic kidney disease with heart failure and stage 1 through stage 4 chronic kidney disease, or unspecified chronic kidney disease: Secondary | ICD-10-CM | POA: Diagnosis present

## 2017-11-25 DIAGNOSIS — E1122 Type 2 diabetes mellitus with diabetic chronic kidney disease: Secondary | ICD-10-CM | POA: Diagnosis not present

## 2017-11-25 DIAGNOSIS — R402360 Coma scale, best motor response, obeys commands, unspecified time: Secondary | ICD-10-CM | POA: Diagnosis not present

## 2017-11-25 DIAGNOSIS — J449 Chronic obstructive pulmonary disease, unspecified: Secondary | ICD-10-CM | POA: Diagnosis not present

## 2017-11-25 DIAGNOSIS — Z880 Allergy status to penicillin: Secondary | ICD-10-CM | POA: Diagnosis not present

## 2017-11-25 DIAGNOSIS — Z7982 Long term (current) use of aspirin: Secondary | ICD-10-CM | POA: Diagnosis not present

## 2017-11-25 DIAGNOSIS — Z86010 Personal history of colonic polyps: Secondary | ICD-10-CM | POA: Diagnosis not present

## 2017-11-25 LAB — BASIC METABOLIC PANEL
Anion gap: 8 (ref 5–15)
BUN: 10 mg/dL (ref 8–23)
CHLORIDE: 106 mmol/L (ref 98–111)
CO2: 28 mmol/L (ref 22–32)
CREATININE: 1.26 mg/dL — AB (ref 0.61–1.24)
Calcium: 8.7 mg/dL — ABNORMAL LOW (ref 8.9–10.3)
GFR calc Af Amer: >60 mL/min (ref >60)
GFR calc non Af Amer: 52 mL/min — ABNORMAL LOW (ref >60)
Glucose, Bld: 133 mg/dL — ABNORMAL HIGH (ref 70–99)
POTASSIUM: 3.5 mmol/L (ref 3.5–5.1)
Sodium: 142 mmol/L (ref 135–145)

## 2017-11-25 MED ORDER — CARVEDILOL 6.25 MG PO TABS
6.2500 mg | ORAL_TABLET | Freq: Two times a day (BID) | ORAL | Status: DC
  Administered 2017-11-26: 6.25 mg via ORAL
  Filled 2017-11-25: qty 1

## 2017-11-25 NOTE — Progress Notes (Signed)
Progress Note  Patient Name: Gary Morse Date of Encounter: 11/25/2017  Primary Cardiologist:   Sinclair Grooms, MD   Subjective   Breathing better this morning than yesterday in the office.  Denies pain.   Inpatient Medications    Scheduled Meds: . aspirin EC  81 mg Oral Daily  . carvedilol  3.125 mg Oral BID WC  . enoxaparin (LOVENOX) injection  40 mg Subcutaneous Q24H  . furosemide  40 mg Intravenous BID  . potassium chloride  20 mEq Oral Daily  . sacubitril-valsartan  1 tablet Oral BID  . sodium chloride flush  3 mL Intravenous Q12H   Continuous Infusions: . sodium chloride     PRN Meds: sodium chloride, acetaminophen, ondansetron (ZOFRAN) IV, sodium chloride flush   Vital Signs    Vitals:   11/24/17 2244 11/25/17 0021 11/25/17 0458 11/25/17 0504  BP:  (!) 157/131  (!) 145/89  Pulse:  79  67  Resp:  20  18  Temp:  98 F (36.7 C)  (!) 97.4 F (36.3 C)  TempSrc:  Oral  Oral  SpO2:  97%  96%  Weight: 197 lb 14.4 oz (89.8 kg)  190 lb 11.2 oz (86.5 kg)   Height: 5\' 8"  (1.727 m)       Intake/Output Summary (Last 24 hours) at 11/25/2017 0824 Last data filed at 11/25/2017 5852 Gross per 24 hour  Intake 723 ml  Output 2300 ml  Net -1577 ml   Filed Weights   11/24/17 1348 11/24/17 2244 11/25/17 0458  Weight: 197 lb 9 oz (89.6 kg) 197 lb 14.4 oz (89.8 kg) 190 lb 11.2 oz (86.5 kg)    Telemetry    NSR, NSVT, atrial and ventricular ectopy. No sustained arrhythmia - Personally Reviewed  ECG    NA - Personally Reviewed  Physical Exam   GEN: No acute distress.   Neck: No  JVD Cardiac: RRR, no murmurs, rubs, or gallops.  Respiratory:   Decreased breath sounds with few expiratory wheezes/upper airway.  GI: Soft, nontender, non-distended  MS:   Trace edema; No deformity. Neuro:  Nonfocal  Psych: Normal affect   Labs    Chemistry Recent Labs  Lab 11/24/17 1409 11/25/17 0356  NA 141 142  K 3.4* 3.5  CL 104 106  CO2 28 28  GLUCOSE 149* 133*    BUN 11 10  CREATININE 1.36* 1.26*  CALCIUM 8.5* 8.7*  PROT 6.6  --   ALBUMIN 3.2*  --   AST 46*  --   ALT 51*  --   ALKPHOS 68  --   BILITOT 0.7  --   GFRNONAA 48* 52*  GFRAA 55* >60  ANIONGAP 9 8     Hematology Recent Labs  Lab 11/24/17 1409  WBC 5.0  RBC 5.64  HGB 15.1  HCT 46.4  MCV 82.3  MCH 26.8  MCHC 32.5  RDW 15.9*  PLT 249    Cardiac EnzymesNo results for input(s): TROPONINI in the last 168 hours. No results for input(s): TROPIPOC in the last 168 hours.   BNP Recent Labs  Lab 11/24/17 1409  BNP 1,832.7*     DDimer No results for input(s): DDIMER in the last 168 hours.   Radiology    No results found.  Cardiac Studies   NA  Patient Profile     83 y.o. male with a hx of prior chronic systolic and diastolic heart failure felt secondary to hypertension, frequent PVCs, and recently noted decline in  LV function EF less than 25-30% July 2019.  Possible correlation with frequent PVCs either primary or secondarily related to LV dysfunction.  He was admitted with acute SOB.    Assessment & Plan    ACUTE SYSTOLIC HF:   Down 1.5 liters since admission.    Continue current IV diuresis at current dose.   HTN:  This is being managed in the context of treating his CHF.  Today I will increase his Coreg to 6.25 mg bid.  Titrate Entresto likely tomorrow pending his renal function.   CKD II:  Creat is stable.   Follow with serial creat.   DM:  Continue current therapy.     For questions or updates, please contact [place] Please consult [email or web address] for contact info under Cardiology/STEMI.   Signed, Minus Breeding, MD  11/25/2017, 8:24 AM

## 2017-11-25 NOTE — Care Management Note (Signed)
Case Management Note  Patient Details  Name: Gary Morse MRN: [id number] Date of Birth: October 14, 1934  Subjective/Objective:  CHF                 Action/Plan: Patient lives at home; PCP:  [name], MD; has private insurance with [facility] with prescription drug coverage; awaiting for Physical Therapy eval for disposition needs; CM will continue to follow for progression of care.    Expected Discharge Date:   11/28/2017               Expected Discharge Plan:  [place]  Discharge planning Services  CM Consult  Status of Service:  In process, will continue to follow  Sherrilyn Rist [phone] 11/25/2017, 2:56 PM

## 2017-11-26 ENCOUNTER — Other Ambulatory Visit: Payer: Self-pay | Admitting: Cardiology

## 2017-11-26 ENCOUNTER — Telehealth: Payer: Self-pay | Admitting: Interventional Cardiology

## 2017-11-26 DIAGNOSIS — I130 Hypertensive heart and chronic kidney disease with heart failure and stage 1 through stage 4 chronic kidney disease, or unspecified chronic kidney disease: Secondary | ICD-10-CM

## 2017-11-26 DIAGNOSIS — I5023 Acute on chronic systolic (congestive) heart failure: Secondary | ICD-10-CM | POA: Diagnosis not present

## 2017-11-26 DIAGNOSIS — I110 Hypertensive heart disease with heart failure: Secondary | ICD-10-CM

## 2017-11-26 LAB — BASIC METABOLIC PANEL
ANION GAP: 11 (ref 5–15)
BUN: 13 mg/dL (ref 8–23)
CALCIUM: 8.7 mg/dL — AB (ref 8.9–10.3)
CO2: 28 mmol/L (ref 22–32)
CREATININE: 1.35 mg/dL — AB (ref 0.61–1.24)
Chloride: 102 mmol/L (ref 98–111)
GFR, EST AFRICAN AMERICAN: 56 mL/min — AB (ref >60)
GFR, EST NON AFRICAN AMERICAN: 48 mL/min — AB (ref >60)
Glucose, Bld: 104 mg/dL — ABNORMAL HIGH (ref 70–99)
Potassium: 3.3 mmol/L — ABNORMAL LOW (ref 3.5–5.1)
SODIUM: 141 mmol/L (ref 135–145)

## 2017-11-26 MED ORDER — CARVEDILOL 6.25 MG PO TABS: 6.2500 mg | ORAL_TABLET | Freq: Two times a day (BID) | ORAL | 3 refills | Status: DC

## 2017-11-26 MED ORDER — SACUBITRIL-VALSARTAN 49-51 MG PO TABS: 1.0000 | ORAL_TABLET | Freq: Two times a day (BID) | ORAL | Status: DC

## 2017-11-26 MED ORDER — SACUBITRIL-VALSARTAN 49-51 MG PO TABS: 1.0000 | ORAL_TABLET | Freq: Two times a day (BID) | ORAL | 3 refills | Status: DC

## 2017-11-26 MED ORDER — POTASSIUM CHLORIDE CRYS ER 20 MEQ PO TBCR: 20.0000 meq | EXTENDED_RELEASE_TABLET | Freq: Every day | ORAL | 5 refills | Status: DC

## 2017-11-26 MED ORDER — POTASSIUM CHLORIDE CRYS ER 20 MEQ PO TBCR
20.0000 meq | EXTENDED_RELEASE_TABLET | Freq: Once | ORAL | Status: AC
  Administered 2017-11-26: 20 meq via ORAL
  Filled 2017-11-26: qty 1

## 2017-11-26 MED ORDER — ALPRAZOLAM 0.5 MG PO TABS
0.5000 mg | ORAL_TABLET | Freq: Once | ORAL | Status: AC
  Administered 2017-11-26: 0.5 mg via ORAL
  Filled 2017-11-26: qty 1

## 2017-11-26 MED ORDER — FUROSEMIDE 20 MG PO TABS: 40.0000 mg | ORAL_TABLET | Freq: Every day | ORAL | 3 refills | Status: DC

## 2017-11-26 NOTE — Discharge Instructions (Signed)
Heart Failure °Heart failure means your heart has trouble pumping blood. This makes it hard for your body to work well. Heart failure is usually a long-term (chronic) condition. You must take good care of yourself and follow your doctor's treatment plan. °Follow these instructions at home: °· Take your heart medicine as told by your doctor. °? Do not stop taking medicine unless your doctor tells you to. °? Do not skip any dose of medicine. °? Refill your medicines before they run out. °? Take other medicines only as told by your doctor or pharmacist. °· Stay active if told by your doctor. The elderly and people with severe heart failure should talk with a doctor about physical activity. °· Eat heart-healthy foods. Choose foods that are without trans fat and are low in saturated fat, cholesterol, and salt (sodium). This includes fresh or frozen fruits and vegetables, fish, lean meats, fat-free or low-fat dairy foods, whole grains, and high-fiber foods. Lentils and dried peas and beans (legumes) are also good choices. °· Limit salt if told by your doctor. °· Cook in a healthy way. Roast, grill, broil, bake, poach, steam, or stir-fry foods. °· Limit fluids as told by your doctor. °· Weigh yourself every morning. Do this after you pee (urinate) and before you eat breakfast. Write down your weight to give to your doctor. °· Take your blood pressure and write it down if your doctor tells you to. °· Ask your doctor how to check your pulse. Check your pulse as told. °· Lose weight if told by your doctor. °· Stop smoking or chewing tobacco. Do not use gum or patches that help you quit without your doctor's approval. °· Schedule and go to doctor visits as told. °· Nonpregnant women should have no more than 1 drink a day. Men should have no more than 2 drinks a day. Talk to your doctor about drinking alcohol. °· Stop illegal drug use. °· Stay current with shots (immunizations). °· Manage your health conditions as told by your  doctor. °· Learn to manage your stress. °· Rest when you are tired. °· If it is really hot outside: °? Avoid intense activities. °? Use air conditioning or fans, or get in a cooler place. °? Avoid caffeine and alcohol. °? Wear loose-fitting, lightweight, and light-colored clothing. °· If it is really cold outside: °? Avoid intense activities. °? Layer your clothing. °? Wear mittens or gloves, a hat, and a scarf when going outside. °? Avoid alcohol. °· Learn about heart failure and get support as needed. °· Get help to maintain or improve your quality of life and your ability to care for yourself as needed. °Contact a doctor if: °· You gain weight quickly. °· You are more short of breath than usual. °· You cannot do your normal activities. °· You tire easily. °· You cough more than normal, especially with activity. °· You have any or more puffiness (swelling) in areas such as your hands, feet, ankles, or belly (abdomen). °· You cannot sleep because it is hard to breathe. °· You feel like your heart is beating fast (palpitations). °· You get dizzy or light-headed when you stand up. °Get help right away if: °· You have trouble breathing. °· There is a change in mental status, such as becoming less alert or not being able to focus. °· You have chest pain or discomfort. °· You faint. °This information is not intended to replace advice given to you by your health care provider. Make sure you   discuss any questions you have with your health care provider. °Document Released: 01/21/2008 Document Revised: 09/19/2015 Document Reviewed: 05/30/2012 °Elsevier Interactive Patient Education © 2017 [employer] ° °

## 2017-11-26 NOTE — Telephone Encounter (Signed)
New Message:       TOC 10-14 days on 12/10/17 at 2:40 with Tamala Julian per Phylliss Bob

## 2017-11-26 NOTE — Progress Notes (Signed)
Progress Note  Patient Name: Gary Morse Date of Encounter: 11/26/2017  Primary Cardiologist:   Sinclair Grooms, MD   Subjective   Breathing at baseline.  No pain.    Inpatient Medications    Scheduled Meds: . aspirin EC  81 mg Oral Daily  . carvedilol  6.25 mg Oral BID WC  . enoxaparin (LOVENOX) injection  40 mg Subcutaneous Q24H  . potassium chloride  20 mEq Oral Daily  . sacubitril-valsartan  1 tablet Oral BID  . sodium chloride flush  3 mL Intravenous Q12H   Continuous Infusions: . sodium chloride     PRN Meds: sodium chloride, acetaminophen, ondansetron (ZOFRAN) IV, sodium chloride flush   Vital Signs    Vitals:   11/26/17 0514 11/26/17 0515 11/26/17 0539 11/26/17 0847  BP: (!) 177/126  (!) 163/97 (!) 122/91  Pulse: 61  79 (!) 33  Resp: 20     Temp: (!) 97.5 F (36.4 C)     TempSrc: Oral     SpO2: 100%   95%  Weight:  190 lb 12.8 oz (86.5 kg)    Height:        Intake/Output Summary (Last 24 hours) at 11/26/2017 0951 Last data filed at 11/26/2017 0851 Gross per 24 hour  Intake 680 ml  Output 2250 ml  Net -1570 ml   Filed Weights   11/24/17 2244 11/25/17 0458 11/26/17 0515  Weight: 197 lb 14.4 oz (89.8 kg) 190 lb 11.2 oz (86.5 kg) 190 lb 12.8 oz (86.5 kg)    Telemetry    NSR - Personally Reviewed  ECG    NA - Personally Reviewed  Physical Exam   GEN: No  acute distress.   Neck: No  JVD Cardiac: RRR, no murmurs, rubs, or gallops.  Respiratory: Clear   to auscultation bilaterally. GI: Soft, nontender, non-distended, normal bowel sounds  MS:  no edema; No deformity. Neuro:   Nonfocal  Psych: Oriented and appropriate   Labs    Chemistry Recent Labs  Lab 11/24/17 1409 11/25/17 0356 11/26/17 0302  NA 141 142 141  K 3.4* 3.5 3.3*  CL 104 106 102  CO2 28 28 28   GLUCOSE 149* 133* 104*  BUN 11 10 13   CREATININE 1.36* 1.26* 1.35*  CALCIUM 8.5* 8.7* 8.7*  PROT 6.6  --   --   ALBUMIN 3.2*  --   --   AST 46*  --   --   ALT 51*  --    --   ALKPHOS 68  --   --   BILITOT 0.7  --   --   GFRNONAA 48* 52* 48*  GFRAA 55* >60 56*  ANIONGAP 9 8 11      Hematology Recent Labs  Lab 11/24/17 1409  WBC 5.0  RBC 5.64  HGB 15.1  HCT 46.4  MCV 82.3  MCH 26.8  MCHC 32.5  RDW 15.9*  PLT 249    Cardiac EnzymesNo results for input(s): TROPONINI in the last 168 hours. No results for input(s): TROPIPOC in the last 168 hours.   BNP Recent Labs  Lab 11/24/17 1409  BNP 1,832.7*     DDimer No results for input(s): DDIMER in the last 168 hours.   Radiology    Dg Chest 2 View  Result Date: 11/25/2017 CLINICAL DATA:  Shortness of breath. EXAM: CHEST - 2 VIEW COMPARISON:  08/30/2017. FINDINGS: Cardiomegaly with mild pulmonary venous congestion. Mild pulmonary interstitial prominence. No pleural effusion or pneumothorax. Degenerative change thoracic  spine. IMPRESSION: Cardiomegaly with mild pulmonary venous congestion. Mild interstitial prominence. These changes may be related to CHF Electronically Signed   By: Marcello Moores  Register   On: 11/25/2017 09:50    Cardiac Studies   NA  Patient Profile     83 y.o. male with a hx of prior chronic systolic and diastolic heart failure felt secondary to hypertension, frequent PVCs, and recently noted decline in LV function EF less than 25-30% July 2019.  Possible correlation with frequent PVCs either primary or secondarily related to LV dysfunction.  He was admitted with acute SOB.    Assessment & Plan    ACUTE SYSTOLIC HF:   Down three liters since admission.   OK to discharge on Lasix 20 mg x 2 daily (can take both pills at the same time.  He has the 20s so continue with these. ) Take an extra 20 mg with 2 lb weight gain.  2 gm Na diet.    HTN:    Coreg increased yesterday.  I will increase the Entresto.  I told him that he does not take Entresto and Cozaar.   CKD II:  Creat is up slightly .   Follow.   Needs TOC appt next week.   DM:  Continue current therapy.     For questions  or updates, please contact [place] Please consult [email or web address] for contact info under Cardiology/STEMI.   Signed, Minus Breeding, MD  11/26/2017, 9:51 AM

## 2017-11-26 NOTE — Discharge Summary (Signed)
Discharge Summary    Patient ID: Gary Morse,  MRN: [id number], DOB/AGE: 01-08-1935 83 y.o.  Admit date: 11/24/2017 Discharge date: 11/26/2017  Primary Care Provider: [name] Primary Cardiologist: Sinclair Grooms, MD  Discharge Diagnoses    Principal Problem:   Acute on chronic systolic heart failure [facility]) Active Problems:   Frequent PVCs   Hypertensive heart disease with heart failure ([place])   Allergies Allergies  Allergen Reactions  . Metformin And Related Other (See Comments)    Unknown reaction  . Penicillins Other (See Comments)    Welps developed. Has patient had a PCN reaction causing immediate rash, facial/tongue/throat swelling, SOB or lightheadedness with hypotension: No Has patient had a PCN reaction causing severe rash involving mucus membranes or skin necrosis: No Has patient had a PCN reaction that required hospitalization: No Has patient had a PCN reaction occurring within the last 10 years: No If all of the above answers are "NO", then may proceed with Cephalosporin use.       Diagnostic Studies/Procedures    None _____________   History of Present Illness     Gary Morse is a 83 y.o. male with a hx of prior chronic systolic and diastolic heart failure felt secondary to hypertension, frequent PVCs, and recently noted decline in LV function EF less than 25-30% July 2019.  Possible correlation with frequent PVCs either primary or secondarily related to LV dysfunction.  He has a ejection fraction of 25 to 30% by recent echo 11/15/17 done because of a history of "cold and congestion in chest" since January.  Medications were adjusted by discontinuing nifedipine and losartan HCTZ (100/12.5 mg).  He was started on furosemide 20 mg/day, Entresto 24/26 mg twice daily, and carvedilol 3.125 mg p.o. twice daily 1 week ago.  He was seen by Dr. Tamala Julian on 11/24/17 and noted to have a 3 day history of orthopnea and PND impairing his sleep. He felt that  furosemide was not causing him to eliminate any fluid. Furosemide on that morning led to significant diuresis and slight improvement in breathing. He was till audible short of breath in the office.   On recent Holter monitor he was noted to have an 18% PVC burden.   He was sent to the hospital for better blood pressure control and IV diuresis with monitoring of renal function.    Hospital Course     Consultants: none  Gary Morse was diuresed with Lasix 40 mg IV BID with net negative 3L fluid balance. Weight is down 7 lbs with discharge wt of 190 lbs. His breathing is now back to baseline.   He will be discharged home on lasix 40 mg daily (2 X 20 mg that he has at home) with an extra 20 mg for >= 2 pound wt gain. He will also need to follow a 2 gram sodium diet restriction. His Carvedilol was increased to 6.25 mg BID. HIs Delene Loll is also being increased. (not to take with Cozaar)  Gary Morse's BP was initially elevated at 157/131. It is somewhat better at 122/91 and 142/92 today. He will be followed up on these medication adjustments.   Scr was stable at 1.35. Will follow up BMet at the office early next week.  Patient has been seen by Dr. Percival Spanish today and deemed ready for discharge home. All follow up appointments have been scheduled. Discharge medications are listed below. _____________  Discharge Vitals Blood pressure (!) 149/92, pulse 66, temperature 97.6 F (36.4 C), temperature  source Oral, resp. rate 20, height 5\' 8"  (1.727 m), weight 190 lb 12.8 oz (86.5 kg), SpO2 98 %.  Filed Weights   11/24/17 2244 11/25/17 0458 11/26/17 0515  Weight: 197 lb 14.4 oz (89.8 kg) 190 lb 11.2 oz (86.5 kg) 190 lb 12.8 oz (86.5 kg)    Labs & Radiologic Studies    CBC Recent Labs    11/24/17 1409  WBC 5.0  NEUTROABS 2.5  HGB 15.1  HCT 46.4  MCV 82.3  PLT 767   Basic Metabolic Panel Recent Labs    11/25/17 0356 11/26/17 0302  NA 142 141  K 3.5 3.3*  CL 106 102  CO2 28 28    GLUCOSE 133* 104*  BUN 10 13  CREATININE 1.26* 1.35*  CALCIUM 8.7* 8.7*   Liver Function Tests Recent Labs    11/24/17 1409  AST 46*  ALT 51*  ALKPHOS 68  BILITOT 0.7  PROT 6.6  ALBUMIN 3.2*   No results for input(s): LIPASE, AMYLASE in the last 72 hours. Cardiac Enzymes No results for input(s): CKTOTAL, CKMB, CKMBINDEX, TROPONINI in the last 72 hours. BNP Invalid input(s): POCBNP D-Dimer No results for input(s): DDIMER in the last 72 hours. Hemoglobin A1C No results for input(s): HGBA1C in the last 72 hours. Fasting Lipid Panel No results for input(s): CHOL, HDL, LDLCALC, TRIG, CHOLHDL, LDLDIRECT in the last 72 hours. Thyroid Function Tests Recent Labs    11/24/17 1409  TSH 1.370   _____________  Dg Chest 2 View  Result Date: 11/25/2017 CLINICAL DATA:  Shortness of breath. EXAM: CHEST - 2 VIEW COMPARISON:  08/30/2017. FINDINGS: Cardiomegaly with mild pulmonary venous congestion. Mild pulmonary interstitial prominence. No pleural effusion or pneumothorax. Degenerative change thoracic spine. IMPRESSION: Cardiomegaly with mild pulmonary venous congestion. Mild interstitial prominence. These changes may be related to CHF Electronically Signed   By: Dotsero   On: 11/25/2017 09:50   Disposition   Pt is being discharged home today in good condition.  Follow-up Plans & Appointments    Follow-up Information    Belva Crome, MD Follow up.   Specialty:  Cardiology Why:  Cardiology hospital follow up on August 16th at 2:40. Please arrive 15 minutes early for check in.  Contact information: [address] [address] [phone]        [place] Follow up.   Specialty:  Cardiology Why:  Please go to the office between 7:30 and 4:30 to have your blood drawn. You do not need to be fasting.  Contact information: [address], [place] [phone]         Discharge  Instructions    ([place]) Call MD:  Anytime you have any of the following symptoms: 1) 3 pound weight gain in 24 hours or 5 pounds in 1 week 2) shortness of breath, with or without a dry hacking cough 3) swelling in the hands, feet or stomach 4) if you have to sleep on extra pillows at night in order to breathe.   Complete by:  As directed    Diet - low sodium heart healthy   Complete by:  As directed    Discharge instructions   Complete by:  As directed    Weight yourself every morning. If you gain 2 pounds of more from the day before, take an extra Lasix pill (furosemide).  If your weight continues to go up, call the cardiology office. [phone]  [phone] Limit your salt intake. Try to get less than 2000 mg of sodium per day- read food labels.   Increase activity slowly   Complete by:  As directed       Discharge Medications   Allergies as of 11/26/2017      Reactions   Metformin And Related Other (See Comments)   Unknown reaction   Penicillins Other (See Comments)   Welps developed. Has patient had a PCN reaction causing immediate rash, facial/tongue/throat swelling, SOB or lightheadedness with hypotension: No Has patient had a PCN reaction causing severe rash involving mucus membranes or skin necrosis: No Has patient had a PCN reaction that required hospitalization: No Has patient had a PCN reaction occurring within the last 10 years: No If all of the above answers are "NO", then may proceed with Cephalosporin use.      Medication List    STOP taking these medications   losartan-hydrochlorothiazide 100-12.5 MG tablet Commonly known as:  HYZAAR   sacubitril-valsartan 24-26 MG Commonly known as:  ENTRESTO Replaced by:  sacubitril-valsartan 49-51 MG     TAKE these medications   aspirin EC 81 MG tablet Take 81 mg by mouth daily.   carvedilol 6.25 MG tablet Commonly known as:  COREG Take 1 tablet (6.25 mg total) by mouth 2 (two) times daily with a meal. What  changed:    medication strength  how much to take  when to take this   fluticasone 50 MCG/ACT nasal spray Commonly known as:  FLONASE Place 1 spray into both nostrils daily as needed for allergies.   furosemide 20 MG tablet Commonly known as:  LASIX Take 2 tablets (40 mg total) by mouth daily. Take an extra tablet (total of 3) if you gain 2 pounds or more. What changed:    how much to take  additional instructions   glimepiride 1 MG tablet Commonly known as:  AMARYL Take 1 mg by mouth daily with breakfast.   potassium chloride SA 20 MEQ tablet Commonly known as:  K-DUR,KLOR-CON Take 1 tablet (20 mEq total) by mouth daily. Start taking on:  11/27/2017   sacubitril-valsartan 49-51 MG Commonly known as:  ENTRESTO Take 1 tablet by mouth 2 (two) times daily. Replaces:  sacubitril-valsartan 24-26 MG        Acute coronary syndrome (MI, NSTEMI, STEMI, etc) this admission?: No.    Outstanding Labs/Studies   BMet on Tuesday to monitor renal function and potassium.  Duration of Discharge Encounter   Greater than 30 minutes including physician time.  Signed, Daune Perch, NP 11/26/2017, 12:09 PM

## 2017-11-26 NOTE — Progress Notes (Signed)
Pt has orders to be discharged. Discharge instructions given and pt has no additional questions at this time. Medication regimen reviewed and pt educated. Pt verbalized understanding and has no additional questions. Telemetry box removed. IV removed and site in good condition. Pt stable and waiting for transportation. 

## 2017-11-29 NOTE — Telephone Encounter (Signed)
**Note De-Identified Gary Morse Obfuscation** Patient contacted regarding discharge from [facility] on 11/26/2017.  Patient understands to follow up with Dr Tamala Julian on 12/10/17 at 2:40 at [facility] [place]. Patient understands discharge instructions? Yes Patient understands medications and regiment? Yes Patient understands to bring all medications to this visit? Yes

## 2017-11-30 ENCOUNTER — Other Ambulatory Visit: Payer: Medicare Other

## 2017-11-30 DIAGNOSIS — I130 Hypertensive heart and chronic kidney disease with heart failure and stage 1 through stage 4 chronic kidney disease, or unspecified chronic kidney disease: Secondary | ICD-10-CM

## 2017-11-30 LAB — BASIC METABOLIC PANEL
BUN/Creatinine Ratio: 10 (ref 10–24)
BUN: 12 mg/dL (ref 8–27)
CO2: 23 mmol/L (ref 20–29)
CREATININE: 1.24 mg/dL (ref 0.76–1.27)
Calcium: 9.2 mg/dL (ref 8.6–10.2)
Chloride: 99 mmol/L (ref 96–106)
GFR, EST AFRICAN AMERICAN: 63 mL/min/{1.73_m2} (ref >59)
GFR, EST NON AFRICAN AMERICAN: 55 mL/min/{1.73_m2} — AB (ref >59)
Glucose: 96 mg/dL (ref 65–99)
POTASSIUM: 4.7 mmol/L (ref 3.5–5.2)
Sodium: 140 mmol/L (ref 134–144)

## 2017-12-01 ENCOUNTER — Telehealth: Payer: Self-pay

## 2017-12-01 NOTE — Telephone Encounter (Signed)
Notes recorded by Frederik Schmidt, RN on 12/01/2017 at 12:19 PM EDT Informed patient of results/recommendations. He verbalized understanding. ------

## 2017-12-01 NOTE — Telephone Encounter (Signed)
Notes recorded by Frederik Schmidt, RN on 12/01/2017 at 8:11 AM EDT Lpm with wife. Patient not available. Will call later.

## 2017-12-01 NOTE — Telephone Encounter (Signed)
-----   Message from Daune Perch, NP sent at 11/30/2017  7:23 PM EDT ----- Kidney function and potassium are normal. Continue current medications. Keep follow up with Dr. Tamala Julian on 8/16.  Daune Perch, NP

## 2017-12-10 ENCOUNTER — Encounter: Payer: Self-pay | Admitting: Interventional Cardiology

## 2017-12-10 ENCOUNTER — Ambulatory Visit: Payer: Medicare Other | Admitting: Interventional Cardiology

## 2017-12-10 VITALS — BP 136/82 | HR 74 | Ht 68.0 in | Wt 193.2 lb

## 2017-12-10 DIAGNOSIS — I4510 Unspecified right bundle-branch block: Secondary | ICD-10-CM

## 2017-12-10 DIAGNOSIS — I130 Hypertensive heart and chronic kidney disease with heart failure and stage 1 through stage 4 chronic kidney disease, or unspecified chronic kidney disease: Secondary | ICD-10-CM | POA: Diagnosis not present

## 2017-12-10 DIAGNOSIS — I493 Ventricular premature depolarization: Secondary | ICD-10-CM

## 2017-12-10 DIAGNOSIS — I5023 Acute on chronic systolic (congestive) heart failure: Secondary | ICD-10-CM

## 2017-12-10 NOTE — Patient Instructions (Addendum)
Medication Instructions:  Your physician recommends that you continue on your current medications as directed. Please refer to the Current Medication list given to you today.  Labwork: BMET, Pro BNP and CBC today  Testing/Procedures: None  Follow-Up: Your physician recommends that you schedule a follow-up appointment in: 6-8 weeks with Dr. Tamala Julian. (Can have 9/19 on 10:40A or 3P or 9/20 at 1:40P)   Any Other Special Instructions Will Be Listed Below (If Applicable).     If you need a refill on your cardiac medications before your next appointment, please call your pharmacy.

## 2017-12-10 NOTE — Progress Notes (Signed)
Cardiology Office Note:    Date:  12/10/2017   ID:  Gary Morse, DOB 07/21/1934, MRN [id number]  PCP:  [name], MD  Cardiologist:  Sinclair Grooms, MD   Referring MD: [name], MD   Chief Complaint  Patient presents with  . Congestive Heart Failure    History of Present Illness:    Gary Morse is a 83 y.o. male with a hx of chronic systolic and diastolic heart failure felt secondary to hypertension, frequent PVCs, and recently noted decline in LV function EF less than 25-30% July 2019.  Possible correlation with frequent PVCs either primary or secondarily related to LV dysfunction.  Occasions transition to GD MT but requiring a hospital stay because of acute decompensation.  Now out of hospital approximately 2 weeks.  Recent decline in heart heart function with EF less than 30%.  Hospitalized because of acute decompensation with dyspnea as medication adjustments were being made as an outpatient.  Etiology of heart failure is not clear.  Prior negative ischemic work-up.  Ischemia work-up was years ago.  Past Medical History:  Diagnosis Date  . Adenomatous polyp    POSITIVE, COLON 7/18, [place]URD COLON NEGATIVE  . Allergic rhinitis   . BPH (benign prostatic hyperplasia)    MICROSCOPIC HEMATURIA WORKUP IN 2004 WITH UROLOGY NEGATIVE, DR. Karsten Ro  . CHF (congestive heart failure) ([place])    EPISODE IN 1994 SECONDARY TO HYPERTENSION, ECHO 2005 NORMAL LV FUNCTION  . CKD (chronic kidney disease)    STAGE 2 MICROALBUMIN POSITIVE  . Colon polyp    COLON IN 2018  . COPD (chronic obstructive pulmonary disease) ([place])    ON X-RAY, CIGAR USE--PFT IN 2013 NORMAL  . Diabetes type 2, controlled ([place])   . Dyslipidemia   . Dyspnea   . ED (erectile dysfunction)   . Hypertension   . Patellar bursitis of right knee    PRE-PATELLA BURSITIS WITH INTERMITTENT SWELLING     Past Surgical History:  Procedure Laterality Date  . COLONOSCOPY  12/2010   10/2016  . INGUINAL HERNIA  REPAIR Right    1976    Current Medications: Current Meds  Medication Sig  . aspirin EC 81 MG tablet Take 81 mg by mouth daily.  . carvedilol (COREG) 6.25 MG tablet Take 1 tablet (6.25 mg total) by mouth 2 (two) times daily with a meal.  . fluticasone (FLONASE) 50 MCG/ACT nasal spray Place 1 spray into both nostrils daily as needed for allergies.   . furosemide (LASIX) 20 MG tablet Take 20 mg by mouth 2 (two) times daily. Take an extra one (1) tablet (20 mg) by mouth daily as needed for weight gain of two (2) pounds or more.  Marland Kitchen glimepiride (AMARYL) 1 MG tablet Take 1 mg by mouth daily with breakfast.  . potassium chloride SA (K-DUR,KLOR-CON) 20 MEQ tablet Take 1 tablet (20 mEq total) by mouth daily.  . sacubitril-valsartan (ENTRESTO) 49-51 MG Take 1 tablet by mouth 2 (two) times daily.     Allergies:   Metformin and related and Penicillins   Social History   Socioeconomic History  . Marital status: Married    Spouse name: Not on file  . Number of children: 7  . Years of education: Not on file  . Highest education level: Not on file  Occupational History  . Occupation: CARPENTER  Social Needs  . Financial resource strain: Not on file  . Food insecurity:    Worry: Not on file    Inability:  Not on file  . Transportation needs:    Medical: Not on file    Non-medical: Not on file  Tobacco Use  . Smoking status: Current Every Day Smoker    Types: Cigars  . Smokeless tobacco: Never Used  Substance and Sexual Activity  . Alcohol use: Yes    Comment: OCCASIONAL  . Drug use: Never  . Sexual activity: Not on file  Lifestyle  . Physical activity:    Days per week: Not on file    Minutes per session: Not on file  . Stress: Not on file  Relationships  . Social connections:    Talks on phone: Not on file    Gets together: Not on file    Attends religious service: Not on file    Active member of club or organization: Not on file    Attends meetings of clubs or organizations:  Not on file    Relationship status: Not on file  Other Topics Concern  . Not on file  Social History Narrative  . Not on file     Family History: The patient's family history includes CAD in his father; Congestive Heart Failure in his sister; Congestive Heart Failure (age of onset: 35) in his mother; Heart attack (age of onset: 42) in his father; Heart failure in his sister; Kidney failure in his brother; Other in his brother; Throat cancer in his brother. There is no history of Colon cancer, Colon polyps, or Liver disease.  ROS:   Please see the history of present illness.    Ills much better.  All other systems reviewed and are negative.  EKGs/Labs/Other Studies Reviewed:    The following studies were reviewed today: No new data  EKG:  EKG is not ordered today.    Recent Labs: 11/24/2017: ALT 51; B Natriuretic Peptide 1,832.7; Hemoglobin 15.1; Platelets 249; TSH 1.370 11/30/2017: BUN 12; Creatinine, Ser 1.24; Potassium 4.7; Sodium 140  Recent Lipid Panel No results found for: CHOL, TRIG, HDL, CHOLHDL, VLDL, LDLCALC, LDLDIRECT  Physical Exam:    VS:  BP 136/82   Pulse 74   Ht 5\' 8"  (1.727 m)   Wt 193 lb 3.2 oz (87.6 kg)   BMI 29.38 kg/m     Wt Readings from Last 3 Encounters:  12/10/17 193 lb 3.2 oz (87.6 kg)  11/26/17 190 lb 12.8 oz (86.5 kg)  11/24/17 202 lb (91.6 kg)     GEN:  Well nourished, well developed in no acute distress HEENT: Normal NECK: No JVD. LYMPHATICS: No lymphadenopathy CARDIAC: RRR, no murmur, S3 gallop, no edema. VASCULAR: 2+ and symmetric radial pulses.  No bruits. RESPIRATORY:  Clear to auscultation without rales, wheezing or rhonchi  ABDOMEN: Soft, non-tender, non-distended, No pulsatile mass, MUSCULOSKELETAL: No deformity  SKIN: Warm and dry NEUROLOGIC:  Alert and oriented x 3 PSYCHIATRIC:  Normal affect   ASSESSMENT:    1. Acute on chronic systolic heart failure, NYHA class 3 ([place])   2. PVC (premature ventricular contraction)   3.  Hypertensive heart and chronic kidney disease with heart failure and stage 1 through stage 4 chronic kidney disease, or chronic kidney disease ([place])   4. RBBB    PLAN:    In order of problems listed above:  1. Resolved acute component.  Now on GDM T including Entresto, furosemide, and Coreg.  Basic metabolic panel will be obtained today.  Depending upon findings may consider adding low-dose Aldactone and decreasing loop diuretic intensity.  22-month clinical follow-up.  Basic  metabolic panel today. 2. Will need to be evaluated as to relationship/etiology of heart failure since PVC density was quite high. 3. These are the presumed etiologies of the patient's heart failure.  Kidney function will be checked today.  Continue GDM T, check kidney function, repeat echo in about 2 to 3 months, clinical follow-up in 1 to 2 months.   Medication Adjustments/Labs and Tests Ordered: Current medicines are reviewed at length with the patient today.  Concerns regarding medicines are outlined above.  Orders Placed This Encounter  Procedures  . Pro b natriuretic peptide  . CBC  . Basic metabolic panel   No orders of the defined types were placed in this encounter.   Patient Instructions  Medication Instructions:  Your physician recommends that you continue on your current medications as directed. Please refer to the Current Medication list given to you today.  Labwork: BMET, Pro BNP and CBC today  Testing/Procedures: None  Follow-Up: Your physician recommends that you schedule a follow-up appointment in: 6-8 weeks with Dr. Tamala Julian. (Can have 9/19 on 10:40A or 3P or 9/20 at 1:40P)   Any Other Special Instructions Will Be Listed Below (If Applicable).     If you need a refill on your cardiac medications before your next appointment, please call your pharmacy.      Signed, Sinclair Grooms, MD  12/10/2017 3:35 PM    [facility]

## 2017-12-11 LAB — CBC
Hematocrit: 49.8 % (ref 37.5–51.0)
Hemoglobin: 16.7 g/dL (ref 13.0–17.7)
MCH: 27.6 pg (ref 26.6–33.0)
MCHC: 33.5 g/dL (ref 31.5–35.7)
MCV: 83 fL (ref 79–97)
Platelets: 286 x10E3/uL (ref 150–450)
RBC: 6.04 x10E6/uL — ABNORMAL HIGH (ref 4.14–5.80)
RDW: 17.5 % — ABNORMAL HIGH (ref 12.3–15.4)
WBC: 6.3 x10E3/uL (ref 3.4–10.8)

## 2017-12-11 LAB — BASIC METABOLIC PANEL WITH GFR
BUN/Creatinine Ratio: 10 (ref 10–24)
BUN: 13 mg/dL (ref 8–27)
CO2: 26 mmol/L (ref 20–29)
Calcium: 9.1 mg/dL (ref 8.6–10.2)
Chloride: 102 mmol/L (ref 96–106)
Creatinine, Ser: 1.29 mg/dL — ABNORMAL HIGH (ref 0.76–1.27)
GFR calc Af Amer: 60 mL/min/1.73 (ref >59)
GFR calc non Af Amer: 52 mL/min/1.73 — ABNORMAL LOW (ref >59)
Glucose: 86 mg/dL (ref 65–99)
Potassium: 4.4 mmol/L (ref 3.5–5.2)
Sodium: 143 mmol/L (ref 134–144)

## 2017-12-11 LAB — PRO B NATRIURETIC PEPTIDE: NT-Pro BNP: 1397 pg/mL — ABNORMAL HIGH (ref 0–486)

## 2018-01-11 ENCOUNTER — Telehealth: Payer: Self-pay | Admitting: Interventional Cardiology

## 2018-01-11 NOTE — Telephone Encounter (Signed)
New Message    *STAT* If patient is at the pharmacy, call can be transferred to refill team.   1. Which medications need to be refilled? (please list name of each medication and dose if known) carvedilol (COREG) 6.25 MG tablet  2. Which pharmacy/location (including street and city if local pharmacy) is medication to be sent to?  [id number] [place] [address] [place] [address]  3. Do they need a 30 day or 90 day supply? 90 Days

## 2018-01-11 NOTE — Telephone Encounter (Signed)
Called pt to inform him that he has refills of carvedilol at his pharmacy and to call his pharmacy to request a refill and if he has any other problems, questions or concerns to call our office back. Pt verbalized understanding.

## 2018-01-12 NOTE — Progress Notes (Signed)
Cardiology Office Note:    Date:  01/13/2018   ID:  Gary Morse, DOB 12-19-1934, MRN [id number]  PCP:  [name], MD  Cardiologist:  Sinclair Grooms, MD   Referring MD: [name], MD   Chief Complaint  Patient presents with  . Congestive Heart Failure    History of Present Illness:    Gary Morse is a 83 y.o. male with a hx of of chronic systolic and diastolic heart failure felt secondary to hypertension, frequent PVCs, and recently noted decline in LV function EF less than 25-30% July 2019.   Starting yesterday he noticed shortness of breath.  Prior to yesterday he is breathing.  He has had a little wheezing.  He has not been lightheaded or dizzy.  He is taking his medications as prescribed.  According to his son he is on furosemide 20 mg tablets, 2 tablets twice daily.  Previous documentation in the chart suggests that he is taking 2 tablets once daily.  They state that he has been on 2 tablets twice daily since discharge from the hospital.  Lab work done 10 days after discharge from the hospital were adequate without evidence of dehydration or hypokalemia.  The shortness of breath over the past 2 days seems to correlate with using carvedilol 6.25 mg tablets.  He states that shortness of breath starts within 5 minutes of taking the medication.  His son is present and identifies that he has been using Alka-Seltzer for a "cold".  Has been taking Alka-Seltzer plus.  Briefly ran out of carvedilol.  May have missed 1 dose.  Otherwise doing okay.   Past Medical History:  Diagnosis Date  . Adenomatous polyp    POSITIVE, COLON 7/18, COLOGAURD COLON NEGATIVE  . Allergic rhinitis   . BPH (benign prostatic hyperplasia)    MICROSCOPIC HEMATURIA WORKUP IN 2004 WITH UROLOGY NEGATIVE, DR. Karsten Ro  . CHF (congestive heart failure) ([place])    EPISODE IN 1994 SECONDARY TO HYPERTENSION, ECHO 2005 NORMAL LV FUNCTION  . CKD (chronic kidney disease)    STAGE 2 MICROALBUMIN  POSITIVE  . Colon polyp    COLON IN 2018  . COPD (chronic obstructive pulmonary disease) ([place])    ON X-RAY, CIGAR USE--PFT IN 2013 NORMAL  . Diabetes type 2, controlled ([place])   . Dyslipidemia   . Dyspnea   . ED (erectile dysfunction)   . Hypertension   . Patellar bursitis of right knee    PRE-PATELLA BURSITIS WITH INTERMITTENT SWELLING     Past Surgical History:  Procedure Laterality Date  . COLONOSCOPY  12/2010   10/2016  . INGUINAL HERNIA REPAIR Right    1976    Current Medications: Current Meds  Medication Sig  . aspirin EC 81 MG tablet Take 81 mg by mouth daily.  . carvedilol (COREG) 6.25 MG tablet Take 1 tablet (6.25 mg total) by mouth 2 (two) times daily with a meal.  . fluticasone (FLONASE) 50 MCG/ACT nasal spray Place 1 spray into both nostrils daily as needed for allergies.   Marland Kitchen glimepiride (AMARYL) 1 MG tablet Take 1 mg by mouth daily with breakfast.  . potassium chloride SA (K-DUR,KLOR-CON) 20 MEQ tablet Take 1 tablet (20 mEq total) by mouth daily.  . sacubitril-valsartan (ENTRESTO) 49-51 MG Take 1 tablet by mouth 2 (two) times daily.  . SYMBICORT 80-4.5 MCG/ACT inhaler Inhale 2 puffs into the lungs 2 (two) times daily.  . [DISCONTINUED] furosemide (LASIX) 20 MG tablet Take two (2) tablets (40 mg) by  mouth twice daily. Take one (1) tablet (20 mg) by mouth daily as needed for weight gain of 2 pounds or more.     Allergies:   Metformin and related and Penicillins   Social History   Socioeconomic History  . Marital status: Married    Spouse name: Not on file  . Number of children: 7  . Years of education: Not on file  . Highest education level: Not on file  Occupational History  . Occupation: CARPENTER  Social Needs  . Financial resource strain: Not on file  . Food insecurity:    Worry: Not on file    Inability: Not on file  . Transportation needs:    Medical: Not on file    Non-medical: Not on file  Tobacco Use  . Smoking status: Current Every Day Smoker     Types: Cigars  . Smokeless tobacco: Never Used  Substance and Sexual Activity  . Alcohol use: Yes    Comment: OCCASIONAL  . Drug use: Never  . Sexual activity: Not on file  Lifestyle  . Physical activity:    Days per week: Not on file    Minutes per session: Not on file  . Stress: Not on file  Relationships  . Social connections:    Talks on phone: Not on file    Gets together: Not on file    Attends religious service: Not on file    Active member of club or organization: Not on file    Attends meetings of clubs or organizations: Not on file    Relationship status: Not on file  Other Topics Concern  . Not on file  Social History Narrative  . Not on file     Family History: The patient's family history includes CAD in his father; Congestive Heart Failure in his sister; Congestive Heart Failure (age of onset: 38) in his mother; Heart attack (age of onset: 42) in his father; Heart failure in his sister; Kidney failure in his brother; Other in his brother; Throat cancer in his brother. There is no history of Colon cancer, Colon polyps, or Liver disease.  ROS:   Please see the history of present illness.    Noting palpitations All other systems reviewed and are negative.  EKGs/Labs/Other Studies Reviewed:    The following studies were reviewed today: None  EKG:  EKG is  ordered today.  The ekg ordered today demonstrates sinus rhythm, right axis deviation, right bundle branch block, premature ventricular contractions, and when compared to the most recent tracing ventricular ectopy is slightly more prominent.  Recent Labs: 11/24/2017: ALT 51; B Natriuretic Peptide 1,832.7; TSH 1.370 12/10/2017: BUN 13; Creatinine, Ser 1.29; Hemoglobin 16.7; NT-Pro BNP 1,397; Platelets 286; Potassium 4.4; Sodium 143  Recent Lipid Panel No results found for: CHOL, TRIG, HDL, CHOLHDL, VLDL, LDLCALC, LDLDIRECT  Physical Exam:    VS:  BP (!) 142/86   Pulse 87   Ht 5\' 8"  (1.727 m)   Wt 194 lb  12.8 oz (88.4 kg)   SpO2 99%   BMI 29.62 kg/m     Wt Readings from Last 3 Encounters:  01/13/18 194 lb 12.8 oz (88.4 kg)  12/10/17 193 lb 3.2 oz (87.6 kg)  11/26/17 190 lb 12.8 oz (86.5 kg)     GEN: Obese well nourished, well developed in no acute distress HEENT: Normal NECK: No JVD. LYMPHATICS: No lymphadenopathy CARDIAC: IRR, 2/6 apical systolic murmur, S3 intermittent gallop, no edema. VASCULAR: Irregular bilateral radial pulses.  No  bruits. RESPIRATORY:  Clear to auscultation faint expiratory wheezes with basilar rales. ABDOMEN: Soft, non-tender, non-distended, No pulsatile mass, MUSCULOSKELETAL: No deformity  SKIN: Warm and dry NEUROLOGIC:  Alert and oriented x 3 PSYCHIATRIC:  Normal affect   ASSESSMENT:    1. Chronic systolic heart failure ([place])   2. Frequent PVCs   3. Hypertensive heart disease with heart failure ([place])   4. RBBB    PLAN:    In order of problems listed above:  1. Complaints suggest the possibility of volume overload.  This could be due to sudden cessation of beta-blocker therapy and over-the-counter use of Alka-Seltzer within the past 48 hours.  Will give 1 dose of 80 mg of furosemide this evening and then back to her typical dose of 40 mg twice daily.  Basic metabolic panel, BNP, and CBC are obtained today. 2. Still present. 3. Blood pressures a little elevated.  Could call along with relatively recent volume overload within the past 48 hours. 4. No change.  Check blood work.  Further adjustments based upon findings from blood work.  He is authorized to take an additional 80 mg dose of furosemide this evening rather than 40 mg.  Clinical follow-up in 3 to 4 weeks.  May need to come back earlier depending upon how he does clinically.   Medication Adjustments/Labs and Tests Ordered: Current medicines are reviewed at length with the patient today.  Concerns regarding medicines are outlined above.  Orders Placed This Encounter  Procedures  . Basic  metabolic panel  . CBC  . Pro b natriuretic peptide   Meds ordered this encounter  Medications  . furosemide (LASIX) 40 MG tablet    Sig: Take one tablet by mouth twice daily.  You may take an additional tablet if you gain 2lbs or more.    Dispense:  210 tablet    Refill:  3    Dose change    Patient Instructions  Medication Instructions:  1) Make sure you are taking Furosemide 40mg  twice daily.  You may take one additional tablet if you have a 2 pound weight gain.   Labwork: BMET, CBC and Pro BNP today  Testing/Procedures: None  Follow-Up: Your physician recommends that you schedule a follow-up appointment in: 6-8 weeks with a PA or NP.    Any Other Special Instructions Will Be Listed Below (If Applicable).     If you need a refill on your cardiac medications before your next appointment, please call your pharmacy.      Signed, Sinclair Grooms, MD  01/13/2018 1:37 PM    [facility]

## 2018-01-13 ENCOUNTER — Encounter: Payer: Self-pay | Admitting: Interventional Cardiology

## 2018-01-13 ENCOUNTER — Telehealth: Payer: Self-pay | Admitting: *Deleted

## 2018-01-13 ENCOUNTER — Ambulatory Visit: Payer: Medicare Other | Admitting: Interventional Cardiology

## 2018-01-13 VITALS — BP 142/86 | HR 87 | Ht 68.0 in | Wt 194.8 lb

## 2018-01-13 DIAGNOSIS — I110 Hypertensive heart disease with heart failure: Secondary | ICD-10-CM

## 2018-01-13 DIAGNOSIS — I5022 Chronic systolic (congestive) heart failure: Secondary | ICD-10-CM

## 2018-01-13 DIAGNOSIS — I4510 Unspecified right bundle-branch block: Secondary | ICD-10-CM | POA: Diagnosis not present

## 2018-01-13 DIAGNOSIS — I493 Ventricular premature depolarization: Secondary | ICD-10-CM

## 2018-01-13 MED ORDER — FUROSEMIDE 40 MG PO TABS: ORAL_TABLET | ORAL | 3 refills | Status: DC

## 2018-01-13 NOTE — Patient Instructions (Addendum)
Medication Instructions:  1) Make sure you are taking Furosemide 40mg  twice daily.  You may take one additional tablet if you have a 2 pound weight gain.   Labwork: BMET, CBC and Pro BNP today  Testing/Procedures: None  Follow-Up: Your physician recommends that you schedule a follow-up appointment in: 6-8 weeks with a PA or NP.    Any Other Special Instructions Will Be Listed Below (If Applicable).     If you need a refill on your cardiac medications before your next appointment, please call your pharmacy.

## 2018-01-13 NOTE — Telephone Encounter (Signed)
-----   Message from Belva Crome, MD sent at 01/13/2018  5:43 PM EDT ----- Let the patient know labs suggest that he is volume overloaded.  Tomorrow he should take 80 mg of furosemide in the a.m.  After that he should go back to taking 40 mg twice daily.  Please take an extra potassium tonight and also take 20 mEq twice tomorrow then back to normal which is 1 tablet a day.  Basic metabolic panel in 1 week.  Office visit with me in 2 weeks.  Notify us if breathing does not improve by tomorrow afternoon. A copy will be sent to [name], MD

## 2018-01-13 NOTE — Telephone Encounter (Signed)
Spoke with pt and wife and went over results and recommendations per Dr. Tamala Julian.  Both verbalized understanding and was in agreement with this plan.

## 2018-01-14 LAB — CBC
Hematocrit: 47.1 % (ref 37.5–51.0)
Hemoglobin: 16.3 g/dL (ref 13.0–17.7)
MCH: 28.2 pg (ref 26.6–33.0)
MCHC: 34.6 g/dL (ref 31.5–35.7)
MCV: 81 fL (ref 79–97)
PLATELETS: 277 10*3/uL (ref 150–450)
RBC: 5.79 x10E6/uL (ref 4.14–5.80)
RDW: 16.5 % — AB (ref 12.3–15.4)
WBC: 4.7 10*3/uL (ref 3.4–10.8)

## 2018-01-14 LAB — BASIC METABOLIC PANEL
BUN/Creatinine Ratio: 11 (ref 10–24)
BUN: 13 mg/dL (ref 8–27)
CALCIUM: 9.2 mg/dL (ref 8.6–10.2)
CHLORIDE: 101 mmol/L (ref 96–106)
CO2: 25 mmol/L (ref 20–29)
Creatinine, Ser: 1.18 mg/dL (ref 0.76–1.27)
GFR calc non Af Amer: 58 mL/min/{1.73_m2} — ABNORMAL LOW (ref >59)
GFR, EST AFRICAN AMERICAN: 67 mL/min/{1.73_m2} (ref >59)
Glucose: 117 mg/dL — ABNORMAL HIGH (ref 65–99)
POTASSIUM: 4.8 mmol/L (ref 3.5–5.2)
Sodium: 140 mmol/L (ref 134–144)

## 2018-01-14 LAB — PRO B NATRIURETIC PEPTIDE: NT-Pro BNP: 3672 pg/mL — ABNORMAL HIGH (ref 0–486)

## 2018-01-14 NOTE — Addendum Note (Signed)
Addended by: Velna Ochs on: 01/14/2018 10:03 AM   Modules accepted: Orders

## 2018-01-20 ENCOUNTER — Other Ambulatory Visit: Payer: Medicare Other | Admitting: *Deleted

## 2018-01-20 DIAGNOSIS — I5022 Chronic systolic (congestive) heart failure: Secondary | ICD-10-CM

## 2018-01-20 LAB — BASIC METABOLIC PANEL
BUN/Creatinine Ratio: 11 (ref 10–24)
BUN: 13 mg/dL (ref 8–27)
CO2: 23 mmol/L (ref 20–29)
Calcium: 9.1 mg/dL (ref 8.6–10.2)
Chloride: 101 mmol/L (ref 96–106)
Creatinine, Ser: 1.19 mg/dL (ref 0.76–1.27)
GFR calc non Af Amer: 57 mL/min/{1.73_m2} — ABNORMAL LOW (ref >59)
GFR, EST AFRICAN AMERICAN: 66 mL/min/{1.73_m2} (ref >59)
GLUCOSE: 93 mg/dL (ref 65–99)
Potassium: 4.4 mmol/L (ref 3.5–5.2)
Sodium: 141 mmol/L (ref 134–144)

## 2018-01-26 NOTE — Progress Notes (Signed)
Cardiology Office Note:    Date:  01/27/2018   ID:  Gary Morse, DOB [birth year], MRN [id number]  PCP:  [name], MD  Cardiologist:  Sinclair Grooms, MD   Referring MD: [name], MD   Chief Complaint  Patient presents with  . Congestive Heart Failure  . Irregular Heart Beat    Premature ventricular contractions    History of Present Illness:    Gary Morse is a 83 y.o. male with a hx of of chronic systolic and diastolic heart failure felt secondary to hypertension, frequent PVCs, and recently noted decline in LV function EF less than 25-30% July 2019.   Gary Morse was doing well after the last office visit when furosemide was increased to 40 mg twice daily.  Starting this past weekend shortness of breath recurred and he realized that he was not taking 40 mg of furosemide twice daily but 40 mg once per day.  He corrected this and breathing has significantly improved.  He denies orthopnea now.  There is been no lower extremity swelling.  No chest pain.  Overall he feels back to normal and states that his weights are at baseline on scales.  When taking 40 mg of furosemide twice daily laboratory data approximately 1 week ago was excellent.  The mistaken change in dose occurred approximately 4 days ago.   Past Medical History:  Diagnosis Date  . Adenomatous polyp    POSITIVE, COLON 7/18, COLOGAURD COLON NEGATIVE  . Allergic rhinitis   . BPH (benign prostatic hyperplasia)    MICROSCOPIC HEMATURIA WORKUP IN 2004 WITH UROLOGY NEGATIVE, DR. Karsten Ro  . CHF (congestive heart failure) ([place])    EPISODE IN 1994 SECONDARY TO HYPERTENSION, ECHO 2005 NORMAL LV FUNCTION  . CKD (chronic kidney disease)    STAGE 2 MICROALBUMIN POSITIVE  . Colon polyp    COLON IN 2018  . COPD (chronic obstructive pulmonary disease) ([place])    ON X-RAY, CIGAR USE--PFT IN 2013 NORMAL  . Diabetes type 2, controlled ([place])   . Dyslipidemia   . Dyspnea   . ED (erectile dysfunction)   . Hypertension     . Patellar bursitis of right knee    PRE-PATELLA BURSITIS WITH INTERMITTENT SWELLING     Past Surgical History:  Procedure Laterality Date  . COLONOSCOPY  12/2010   10/2016  . INGUINAL HERNIA REPAIR Right    1976    Current Medications: Current Meds  Medication Sig  . aspirin EC 81 MG tablet Take 81 mg by mouth daily.  . carvedilol (COREG) 6.25 MG tablet Take 1 tablet (6.25 mg total) by mouth 2 (two) times daily with a meal.  . fluticasone (FLONASE) 50 MCG/ACT nasal spray Place 1 spray into both nostrils daily as needed for allergies.   . furosemide (LASIX) 40 MG tablet Take one tablet by mouth twice daily.  You may take an additional tablet if you gain 2lbs or more.  Marland Kitchen glimepiride (AMARYL) 1 MG tablet Take 1 mg by mouth daily with breakfast.  . potassium chloride SA (K-DUR,KLOR-CON) 20 MEQ tablet Take 1 tablet (20 mEq total) by mouth daily.  . sacubitril-valsartan (ENTRESTO) 49-51 MG Take 1 tablet by mouth 2 (two) times daily.  . SYMBICORT 80-4.5 MCG/ACT inhaler Inhale 2 puffs into the lungs 2 (two) times daily.     Allergies:   Metformin and related and Penicillins   Social History   Socioeconomic History  . Marital status: Married    Spouse name: Not on file  .  Number of children: 7  . Years of education: Not on file  . Highest education level: Not on file  Occupational History  . Occupation: CARPENTER  Social Needs  . Financial resource strain: Not on file  . Food insecurity:    Worry: Not on file    Inability: Not on file  . Transportation needs:    Medical: Not on file    Non-medical: Not on file  Tobacco Use  . Smoking status: Current Every Day Smoker    Types: Cigars  . Smokeless tobacco: Never Used  Substance and Sexual Activity  . Alcohol use: Yes    Comment: OCCASIONAL  . Drug use: Never  . Sexual activity: Not on file  Lifestyle  . Physical activity:    Days per week: Not on file    Minutes per session: Not on file  . Stress: Not on file   Relationships  . Social connections:    Talks on phone: Not on file    Gets together: Not on file    Attends religious service: Not on file    Active member of club or organization: Not on file    Attends meetings of clubs or organizations: Not on file    Relationship status: Not on file  Other Topics Concern  . Not on file  Social History Narrative  . Not on file     Family History: The patient's family history includes CAD in his father; Congestive Heart Failure in his sister; Congestive Heart Failure (age of onset: 35) in his mother; Heart attack (age of onset: 58) in his father; Heart failure in his sister; Kidney failure in his brother; Other in his brother; Throat cancer in his brother. There is no history of Colon cancer, Colon polyps, or Liver disease.  ROS:   Please see the history of present illness.    Dyspnea has improved.  He recited his medication regimen and seems to fully understand the doses of each medication that he is taking.  All other systems reviewed and are negative.  EKGs/Labs/Other Studies Reviewed:    The following studies were reviewed today: No new data  EKG:  EKG is not repeated/ordered today.    Recent Labs: 11/24/2017: ALT 51; B Natriuretic Peptide 1,832.7; TSH 1.370 01/13/2018: Hemoglobin 16.3; NT-Pro BNP 3,672; Platelets 277 01/20/2018: BUN 13; Creatinine, Ser 1.19; Potassium 4.4; Sodium 141  Recent Lipid Panel No results found for: CHOL, TRIG, HDL, CHOLHDL, VLDL, LDLCALC, LDLDIRECT  Physical Exam:    VS:  BP (!) 142/88   Pulse 63   Ht 5\' 5"  (1.651 m)   Wt 190 lb 6.4 oz (86.4 kg)   BMI 31.68 kg/m     Wt Readings from Last 3 Encounters:  01/27/18 190 lb 6.4 oz (86.4 kg)  01/13/18 194 lb 12.8 oz (88.4 kg)  12/10/17 193 lb 3.2 oz (87.6 kg)     GEN:  Well nourished, well developed in no acute distress HEENT: Normal NECK: No JVD. LYMPHATICS: No lymphadenopathy CARDIAC: IRR, no murmur, S4 gallop, no edema. VASCULAR: 2+ bilateral radial  pulses.  No bruits. RESPIRATORY:  Clear to auscultation without rales, wheezing or rhonchi  ABDOMEN: Soft, non-tender, non-distended, No pulsatile mass, MUSCULOSKELETAL: No deformity  SKIN: Warm and dry NEUROLOGIC:  Alert and oriented x 3 PSYCHIATRIC:  Normal affect   ASSESSMENT:    1. Chronic systolic heart failure ([place])   2. Hypertensive heart disease with heart failure ([place])   3. RBBB   4. PVC (premature  ventricular contraction)   5. Hypertensive heart and chronic kidney disease with heart failure and stage 1 through stage 4 chronic kidney disease, or chronic kidney disease ([place])    PLAN:    In order of problems listed above:  1. Volume status appears euvolemic.  No dyspnea now.  He understands that correct diuretic regimen is furosemide 40 mg twice daily. 2. Adequate blood pressure control.  Preferred blood pressure is 130/80 mmHg.  He has not yet had his a.m. dose of furosemide.  Low-salt diet discussed. 3. Asymptomatic premature ventricular contractions 4. Stable  Clinical follow-up in 1 month with team member.  I should probably see him 1 to 2 months thereafter.  Now on optimized heart failure therapy we should repeat his echocardiogram approximately 3-4 months after Entresto initiation   Medication Adjustments/Labs and Tests Ordered: Current medicines are reviewed at length with the patient today.  Concerns regarding medicines are outlined above.  Orders Placed This Encounter  Procedures  . Basic metabolic panel   No orders of the defined types were placed in this encounter.   Patient Instructions  Medication Instructions:  Your physician recommends that you continue on your current medications as directed. Please refer to the Current Medication list given to you today.  Labwork: BMET at the time of your follow up appointment  Testing/Procedures: None  Follow-Up: Your physician recommends that you schedule a follow-up appointment in: 1 month with a PA or NP.     Any Other Special Instructions Will Be Listed Below (If Applicable).  Make sure you are weighing everyday at the same time with the same amount of clothing on.  Please contact the office if you gain 3 pounds overnight or 5 pounds in a week.   If you need a refill on your cardiac medications before your next appointment, please call your pharmacy.      Signed, Sinclair Grooms, MD  01/27/2018 9:45 AM    [facility]

## 2018-01-27 ENCOUNTER — Ambulatory Visit: Payer: Medicare Other | Admitting: Interventional Cardiology

## 2018-01-27 ENCOUNTER — Encounter: Payer: Self-pay | Admitting: Interventional Cardiology

## 2018-01-27 VITALS — BP 142/88 | HR 63 | Ht 65.0 in | Wt 190.4 lb

## 2018-01-27 DIAGNOSIS — I493 Ventricular premature depolarization: Secondary | ICD-10-CM | POA: Diagnosis not present

## 2018-01-27 DIAGNOSIS — I130 Hypertensive heart and chronic kidney disease with heart failure and stage 1 through stage 4 chronic kidney disease, or unspecified chronic kidney disease: Secondary | ICD-10-CM

## 2018-01-27 DIAGNOSIS — I5022 Chronic systolic (congestive) heart failure: Secondary | ICD-10-CM

## 2018-01-27 DIAGNOSIS — I110 Hypertensive heart disease with heart failure: Secondary | ICD-10-CM

## 2018-01-27 NOTE — Patient Instructions (Signed)
Medication Instructions:  Your physician recommends that you continue on your current medications as directed. Please refer to the Current Medication list given to you today.  Labwork: BMET at the time of your follow up appointment  Testing/Procedures: None  Follow-Up: Your physician recommends that you schedule a follow-up appointment in: 1 month with a PA or NP.    Any Other Special Instructions Will Be Listed Below (If Applicable).  Make sure you are weighing everyday at the same time with the same amount of clothing on.  Please contact the office if you gain 3 pounds overnight or 5 pounds in a week.   If you need a refill on your cardiac medications before your next appointment, please call your pharmacy.

## 2018-02-28 NOTE — Progress Notes (Signed)
CARDIOLOGY OFFICE NOTE  Date:  03/01/2018    [name] Date of Birth: [birth year] Medical Record #[id number]  PCP:  [name], MD  Cardiologist:  Tamala Julian    Chief Complaint  Patient presents with  . Congestive Heart Failure    Follow up visit - seen for Dr. Tamala Julian    History of Present Illness: Gary Morse is a 83 y.o. male who presents today for a one month check. Seen for Dr. Tamala Julian.   He has a history of chronic systolic and diastolic heart failure felt to be secondary to hypertension. Other issues include frequent PVCs, and recently noted decline in LV function EF less than25-30%per echo in July 2019. Remote negative work up for ischemia noted in the record.   Seen a month ago - has had to have increasing doses of diuretic therapy for shortness of breath - some confusion with his medicines. Symptoms improved with correct dosing.   Comes in today. Here with his son Gary Morse. Gary Morse augments the history - seems to be doing better overall. Still with pretty congested cough. Still probably using too much salt. He says his breathing has improved dramatically. No swelling. He tells me he feels "100% better" than last time he was seen. Not dizzy. No syncope. Son also feels like his dad is doing better - just needs better salt restriction. He is on Entresto along with Coreg. He does report lots of urinary frequency with the diuretic.   Past Medical History:  Diagnosis Date  . Adenomatous polyp    POSITIVE, COLON 7/18, COLO[place]UR[place] COLON NEGATIVE  . Allergic rhinitis   . BPH (benign prostatic hyperplasia)    MICROSCOPIC HEMATURIA WORKUP IN 2004 WITH UROLOGY NEGATIVE, DR. Karsten Ro  . CHF (congestive heart failure) ([place])    EPISODE IN 1994 SECONDARY TO HYPERTENSION, ECHO 2005 NORMAL LV FUNCTION  . CKD (chronic kidney disease)    STAGE 2 MICROALBUMIN POSITIVE  . Colon polyp    COLON IN 2018  . COPD (chronic obstructive pulmonary disease) ([place])    ON X-RAY, CIGAR USE--PFT  IN 2013 NORMAL  . Diabetes type 2, controlled ([place])   . Dyslipidemia   . Dyspnea   . ED (erectile dysfunction)   . Hypertension   . Patellar bursitis of right knee    PRE-PATELLA BURSITIS WITH INTERMITTENT SWELLING     Past Surgical History:  Procedure Laterality Date  . COLONOSCOPY  12/2010   10/2016  . INGUINAL HERNIA REPAIR Right    1976     Medications: Current Meds  Medication Sig  . aspirin EC 81 MG tablet Take 81 mg by mouth daily.  . carvedilol (COREG) 6.25 MG tablet Take 1 tablet (6.25 mg total) by mouth 2 (two) times daily with a meal.  . fluticasone (FLONASE) 50 MCG/ACT nasal spray Place 1 spray into both nostrils daily as needed for allergies.   . furosemide (LASIX) 40 MG tablet Take one tablet by mouth twice daily.  You may take an additional tablet if you gain 2lbs or more.  Marland Kitchen glimepiride (AMARYL) 1 MG tablet Take 1 mg by mouth daily with breakfast.  . potassium chloride SA (K-DUR,KLOR-CON) 20 MEQ tablet Take 1 tablet (20 mEq total) by mouth daily.  . sacubitril-valsartan (ENTRESTO) 49-51 MG Take 1 tablet by mouth 2 (two) times daily.  . SYMBICORT 80-4.5 MCG/ACT inhaler Inhale 2 puffs into the lungs 2 (two) times daily.     Allergies: Allergies  Allergen Reactions  . Metformin And  Related Other (See Comments)    Unknown reaction  . Penicillins Other (See Comments)    Welps developed. Has patient had a PCN reaction causing immediate rash, facial/tongue/throat swelling, SOB or lightheadedness with hypotension: No Has patient had a PCN reaction causing severe rash involving mucus membranes or skin necrosis: No Has patient had a PCN reaction that required hospitalization: No Has patient had a PCN reaction occurring within the last 10 years: No If all of the above answers are "NO", then may proceed with Cephalosporin use.       Social History: The patient  reports that he has been smoking cigars. He has never used smokeless tobacco. He reports that he drinks  alcohol. He reports that he does not use drugs.   Family History: The patient's family history includes CAD in his father; Congestive Heart Failure in his sister; Congestive Heart Failure (age of onset: 85) in his mother; Heart attack (age of onset: 28) in his father; Heart failure in his sister; Kidney failure in his brother; Other in his brother; Throat cancer in his brother.   Review of Systems: Please see the history of present illness.   Otherwise, the review of systems is positive for none.   All other systems are reviewed and negative.   Physical Exam: VS:  BP 136/80 (BP Location: Left Arm, Patient Position: Sitting, Cuff Size: Normal)   Pulse 79   Ht 5\' 8"  (1.727 m)   Wt 193 lb 12.8 oz (87.9 kg)   SpO2 98% Comment: at rest  BMI 29.47 kg/m  .  BMI Body mass index is 29.47 kg/m.  Wt Readings from Last 3 Encounters:  03/01/18 193 lb 12.8 oz (87.9 kg)  01/27/18 190 lb 6.4 oz (86.4 kg)  01/13/18 194 lb 12.8 oz (88.4 kg)    General: Pleasant. Elderly. Alert and in no acute distress.  He is hard of hearing.  HEENT: Normal.  Neck: Supple, no JVD, carotid bruits, or masses noted.  Cardiac: Regular rate and rhythm. Heart tones are distant. No edema.  Respiratory:  Lungs are clear to auscultation bilaterally with normal work of breathing.  GI: Soft and nontender.  MS: No deformity or atrophy. Gait and ROM intact.  Skin: Warm and dry. Color is normal.  Neuro:  Strength and sensation are intact and no gross focal deficits noted.  Psych: Alert, appropriate and with normal affect.   LABORATORY DATA:  EKG:  EKG is not ordered today.  Lab Results  Component Value Date   WBC 4.7 01/13/2018   HGB 16.3 01/13/2018   HCT 47.1 01/13/2018   PLT 277 01/13/2018   GLUCOSE 93 01/20/2018   ALT 51 (H) 11/24/2017   AST 46 (H) 11/24/2017   NA 141 01/20/2018   K 4.4 01/20/2018   CL 101 01/20/2018   CREATININE 1.19 01/20/2018   BUN 13 01/20/2018   CO2 23 01/20/2018   TSH 1.370 11/24/2017    INR 0.98 11/24/2017       BNP (last 3 results) Recent Labs    11/24/17 1409  BNP 1,832.7*    ProBNP (last 3 results) Recent Labs    12/10/17 1500 01/13/18 1151  PROBNP 1,397* 3,672*     Other Studies Reviewed Today:  Echo Study Conclusions 10/2017  - Left ventricle: The cavity size was normal. Wall thickness was   increased in a pattern of moderate LVH. Systolic function was   severely reduced. The estimated ejection fraction was in the   range of 25%  to 30%. Diffuse hypokinesis. Doppler parameters are   consistent with pseudonormal left ventricular relaxation (grade 2   diastolic dysfunction). The E/e&' ratio is >15, suggesting   elevated LV filling pressure. - Mitral valve: Mildly thickened leaflets . There was trivial   regurgitation. - Left atrium: Severely dilated. - Inferior vena cava: The vessel was dilated. The respirophasic   diameter changes were blunted (< 50%), consistent with elevated   central venous pressure.  Impressions:  - LVEF 25-30%, moderate LVH, severe global hypokinesis, grade 2 DD,   elevated LV filling pressure, severe LAE, dilated IVC. Notes recorded by Belva Crome, MD on 11/15/2017 at 8:53 PM EDT Let the patient know heart is very weak. Need to make adjustment in meds. Stop losartan hctz, start Entresto 24/26 mg BID 24 hours later. Start Carvedilol 3.125 mg BID. Stop nifedipine. Start furosemide 20 mg daily after Losartan Hct stopped. Needs OV next week with BMP after med change. A copy will be sent to [name], MD   Holter Study Highlights     NSR  Rare PAC's  Frequent PVC's, with up to 5 beat runs. Burden of PVC's 18%  No atrial fib or sustained arrhythmias  Occasional blocked PAC's   HEART RATE EPISODES Minimum HR: 54 BPM at 6:26:14 AM Maximum HR: 130 BPM at 10:31:33 PM Average HR: 90 BPM     Assessment/Plan:  1. Chronic systolic HF - on Entresto and Coreg - needs follow up echo in a few weeks. Better  clinically. NYHA II/III. Lab today.   2. HTN - BP ok. No changes made.   3. RBBB  4. High burden of PVCs - asymptomatic - unclear to me how much this is contributing to his low EF - he is on beta blocker - will see what the echo shows - ? Refer to EP or manage medically.   5. CKD - lab today.   Current medicines are reviewed with the patient today.  The patient does not have concerns regarding medicines other than what has been noted above.  The following changes have been made:  See above.  Labs/ tests ordered today include:   No orders of the defined types were placed in this encounter.    Disposition:   FU with Dr. Tamala Julian in about 2 months. I am happy to see back as needed.    Patient is agreeable to this plan and will call if any problems develop in the interim.   SignedTruitt Merle, NP  03/01/2018 9:00 AM  [facility] [address] [place], [place]  [address] Phone: [phone] Fax: [phone]

## 2018-03-01 ENCOUNTER — Encounter: Payer: Self-pay | Admitting: Nurse Practitioner

## 2018-03-01 ENCOUNTER — Other Ambulatory Visit: Payer: Medicare Other

## 2018-03-01 ENCOUNTER — Ambulatory Visit: Payer: Medicare Other | Admitting: Nurse Practitioner

## 2018-03-01 VITALS — BP 136/80 | HR 79 | Ht 68.0 in | Wt 193.8 lb

## 2018-03-01 DIAGNOSIS — I5022 Chronic systolic (congestive) heart failure: Secondary | ICD-10-CM | POA: Diagnosis not present

## 2018-03-01 NOTE — Patient Instructions (Addendum)
We will be checking the following labs today - BMEt and BNP  If you have labs (blood work) drawn today and your tests are completely normal, you will receive your results only by: Marland Kitchen MyChart Message (if you have MyChart) OR . A paper copy in the mail If you have any lab test that is abnormal or we need to change your treatment, we will call you to review the results.   Medication Instructions:    Continue with your current medicines.    If you need a refill on your cardiac medications before your next appointment, please call your pharmacy.     Testing/Procedures To Be Arranged:  Echocardiogram in few weeks  Follow-Up:   See Dr. Tamala Julian in about 1 to 2 months    At [facility], you and your health needs are our priority.  As part of our continuing mission to provide you with exceptional heart care, we have created designated Provider Care Teams.  These Care Teams include your primary Cardiologist (physician) and Advanced Practice Providers (APPs -  Physician Assistants and Nurse Practitioners) who all work together to provide you with the care you need, when you need it.  Special Instructions:  . Keep trying to restrict your salt.   Call the [place] office at [phone] if you have any questions, problems or concerns.

## 2018-03-02 LAB — BASIC METABOLIC PANEL
BUN/Creatinine Ratio: 10 (ref 10–24)
BUN: 12 mg/dL (ref 8–27)
CO2: 27 mmol/L (ref 20–29)
Calcium: 9.1 mg/dL (ref 8.6–10.2)
Chloride: 100 mmol/L (ref 96–106)
Creatinine, Ser: 1.25 mg/dL (ref 0.76–1.27)
GFR calc Af Amer: 62 mL/min/{1.73_m2} (ref >59)
GFR calc non Af Amer: 54 mL/min/{1.73_m2} — ABNORMAL LOW (ref >59)
Glucose: 105 mg/dL — ABNORMAL HIGH (ref 65–99)
Potassium: 4.2 mmol/L (ref 3.5–5.2)
Sodium: 143 mmol/L (ref 134–144)

## 2018-03-02 LAB — PRO B NATRIURETIC PEPTIDE: NT-Pro BNP: 2804 pg/mL — ABNORMAL HIGH (ref 0–486)

## 2018-03-02 NOTE — Progress Notes (Signed)
PVC burden was 18% on Holter. Slightly less than the 20% required to cause decreased EF but close. I am not opposed to EP opinion.

## 2018-03-07 ENCOUNTER — Telehealth: Payer: Self-pay | Admitting: *Deleted

## 2018-03-07 NOTE — Telephone Encounter (Signed)
-----   Message from Burtis Junes, NP sent at 03/04/2018  8:10 AM EST ----- Ok to wait and see what echo shows? It is scheduled for next week.   lori ----- Message ----- From: Belva Crome, MD Sent: 03/02/2018   5:46 PM EST To: Burtis Junes, NP    ----- Message ----- From: Burtis Junes, NP Sent: 03/01/2018   9:04 AM EST To: Belva Crome, MD

## 2018-03-07 NOTE — Telephone Encounter (Signed)
Yes, echo is scheduled for 11/18.

## 2018-03-14 ENCOUNTER — Ambulatory Visit (HOSPITAL_COMMUNITY): Payer: Medicare Other | Attending: Cardiology

## 2018-03-14 ENCOUNTER — Other Ambulatory Visit: Payer: Self-pay

## 2018-03-14 DIAGNOSIS — I5022 Chronic systolic (congestive) heart failure: Secondary | ICD-10-CM | POA: Insufficient documentation

## 2018-03-15 ENCOUNTER — Other Ambulatory Visit: Payer: Self-pay | Admitting: *Deleted

## 2018-03-15 DIAGNOSIS — I493 Ventricular premature depolarization: Secondary | ICD-10-CM

## 2018-03-15 NOTE — Progress Notes (Signed)
error 

## 2018-04-04 ENCOUNTER — Encounter: Payer: Self-pay | Admitting: Internal Medicine

## 2018-04-04 ENCOUNTER — Encounter (INDEPENDENT_AMBULATORY_CARE_PROVIDER_SITE_OTHER): Payer: Self-pay

## 2018-04-04 ENCOUNTER — Ambulatory Visit: Payer: Medicare Other | Admitting: Internal Medicine

## 2018-04-04 VITALS — BP 134/68 | HR 92 | Ht 68.0 in | Wt 192.0 lb

## 2018-04-04 DIAGNOSIS — I493 Ventricular premature depolarization: Secondary | ICD-10-CM

## 2018-04-04 DIAGNOSIS — I5022 Chronic systolic (congestive) heart failure: Secondary | ICD-10-CM

## 2018-04-04 DIAGNOSIS — I10 Essential (primary) hypertension: Secondary | ICD-10-CM | POA: Diagnosis not present

## 2018-04-04 MED ORDER — AMIODARONE HCL 200 MG PO TABS: 200.0000 mg | ORAL_TABLET | Freq: Every day | ORAL | 3 refills | Status: DC

## 2018-04-04 NOTE — Progress Notes (Signed)
HPI Gary Morse is referred by Truitt Merle for evaluation of PVC"s in the setting of newly diagnosed LV dysfunction. 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 He developed sob and fatigue several months ago. He has a remote h/o syncope. This occurred after her consumed some "energy bars". He has class 2 symptoms. He denies peripheral edema. He wore a cardiac monitor which demonstrated polymorphic PVC's representing a burden of 18%. Allergies  Allergen Reactions  . Metformin And Related Other (See Comments)    Unknown reaction  . Penicillins Other (See Comments)    Welps developed. Has patient had a PCN reaction causing immediate rash, facial/tongue/throat swelling, SOB or lightheadedness with hypotension: No Has patient had a PCN reaction causing severe rash involving mucus membranes or skin necrosis: No Has patient had a PCN reaction that required hospitalization: No Has patient had a PCN reaction occurring within the last 10 years: No If all of the above answers are "NO", then may proceed with Cephalosporin use.        Current Outpatient Medications  Medication Sig Dispense Refill  . aspirin EC 81 MG tablet Take 81 mg by mouth daily.    . carvedilol (COREG) 6.25 MG tablet Take 1 tablet (6.25 mg total) by mouth 2 (two) times daily with a meal. 180 tablet 3  . fluticasone (FLONASE) 50 MCG/ACT nasal spray Place 1 spray into both nostrils daily as needed for allergies.     . furosemide (LASIX) 40 MG tablet Take one tablet by mouth twice daily.  You may take an additional tablet if you gain 2lbs or more. 210 tablet 3  . glimepiride (AMARYL) 1 MG tablet Take 1 mg by mouth daily with breakfast.    . potassium chloride SA (K-DUR,KLOR-CON) 20 MEQ tablet Take 1 tablet (20 mEq total) by mouth daily. 30 tablet 5  . sacubitril-valsartan (ENTRESTO) 49-51 MG Take 1 tablet by mouth 2 (two) times daily. 180 tablet 3  . SYMBICORT 80-4.5 MCG/ACT inhaler Inhale 2 puffs into the  lungs 2 (two) times daily.  12   No current facility-administered medications for this visit.      Past Medical History:  Diagnosis Date  . Adenomatous polyp    POSITIVE, COLON 7/18, COLO[place]UR[place] COLON NEGATIVE  . Allergic rhinitis   . BPH (benign prostatic hyperplasia)    MICROSCOPIC HEMATURIA WORKUP IN 2004 WITH UROLOGY NEGATIVE, DR. Karsten Ro  . CHF (congestive heart failure) ([place])    EPISODE IN 1994 SECONDARY TO HYPERTENSION, ECHO 2005 NORMAL LV FUNCTION  . CKD (chronic kidney disease)    STAGE 2 MICROALBUMIN POSITIVE  . Colon polyp    COLON IN 2018  . COPD (chronic obstructive pulmonary disease) ([place])    ON X-RAY, CIGAR USE--PFT IN 2013 NORMAL  . Diabetes type 2, controlled ([place])   . Dyslipidemia   . Dyspnea   . ED (erectile dysfunction)   . Hypertension   . Patellar bursitis of right knee    PRE-PATELLA BURSITIS WITH INTERMITTENT SWELLING     ROS:   All systems reviewed and negative except as noted in the HPI.   Past Surgical History:  Procedure Laterality Date  . COLONOSCOPY  12/2010   10/2016  . INGUINAL HERNIA REPAIR Right    1976     Family History  Problem Relation Age of Onset  . Congestive Heart Failure Mother 69  . Heart attack Father 63  . CAD Father   . Heart failure Sister   .  Congestive Heart Failure Sister   . Throat cancer Brother   . Kidney failure Brother   . Other Brother        END STAGE RENAL DISEASE  . Colon cancer Neg Hx   . Colon polyps Neg Hx   . Liver disease Neg Hx      Social History   Socioeconomic History  . Marital status: Married    Spouse name: Not on file  . Number of children: 7  . Years of education: Not on file  . Highest education level: Not on file  Occupational History  . Occupation: CARPENTER  Social Needs  . Financial resource strain: Not on file  . Food insecurity:    Worry: Not on file    Inability: Not on file  . Transportation needs:    Medical: Not on file    Non-medical: Not on file  Tobacco  Use  . Smoking status: Current Every Day Smoker    Types: Cigars  . Smokeless tobacco: Never Used  Substance and Sexual Activity  . Alcohol use: Yes    Comment: OCCASIONAL  . Drug use: Never  . Sexual activity: Not on file  Lifestyle  . Physical activity:    Days per week: Not on file    Minutes per session: Not on file  . Stress: Not on file  Relationships  . Social connections:    Talks on phone: Not on file    Gets together: Not on file    Attends religious service: Not on file    Active member of club or organization: Not on file    Attends meetings of clubs or organizations: Not on file    Relationship status: Not on file  . Intimate partner violence:    Fear of current or ex partner: Not on file    Emotionally abused: Not on file    Physically abused: Not on file    Forced sexual activity: Not on file  Other Topics Concern  . Not on file  Social History Narrative  . Not on file     BP 134/68   Pulse 92   Ht 5\' 8"  (1.727 m)   Wt 192 lb (87.1 kg)   BMI 29.19 kg/m   Physical Exam:  Well appearing NAD HEENT: Unremarkable Neck:  No JVD, no thyromegally Lymphatics:  No adenopathy Back:  No CVA tenderness Lungs:  Clear with no wheezes HEART:  Regular rate rhythm, no murmurs, no rubs, no clicks Abd:  soft, positive bowel sounds, no organomegally, no rebound, no guarding Ext:  2 plus pulses, no edema, no cyanosis, no clubbing Skin:  No rashes no nodules Neuro:  CN II through XII intact, motor grossly intact  EKG - nsr with RBBB and polymorphic PVC's   Assess/Plan: 1. PVC's - he does not have much in the way of palpitations. I think that they are contributin to his symptoms and may be contributing to his LV dysfunction. I have recommended he start low dose amiodarone. If his symptoms improve in 3 months then I will continue this. If not then I will have him stop amio. 2. Chronic systolic heart failure - he is on optimal medical therapy. I do not think he is a  candidate for prophylactic ICD based on his advanced age and multiple comorbidities. We do not have any data to support this. 3. HTN - his blood pressure is controlled. He is encouraged to maintain a low sodium diet.  Mikle Bosworth.D.

## 2018-04-04 NOTE — Patient Instructions (Addendum)
Medication Instructions:  Your physician has recommended you make the following change in your medication:   1.  Start taking amiodarone 200 mg---Take one tablet by mouth daily.   Labwork: None ordered.  Testing/Procedures: None ordered.  Follow-Up: Your physician wants you to follow-up in: 3 months with Dr. Lovena Le.     July 12, 2017 at 12:00 pm.  Please arrive at 11:45 am to check in.   Any Other Special Instructions Will Be Listed Below (If Applicable).  If you need a refill on your cardiac medications before your next appointment, please call your pharmacy.   Amiodarone tablets What is this medicine? AMIODARONE (a MEE oh da rone) is an antiarrhythmic drug. It helps make your heart beat regularly. Because of the side effects caused by this medicine, it is only used when other medicines have not worked. It is usually used for heartbeat problems that may be life threatening. This medicine may be used for other purposes; ask your health care provider or pharmacist if you have questions. COMMON BRAND NAME(S): Cordarone, Pacerone What should I tell my health care provider before I take this medicine? They need to know if you have any of these conditions: -liver disease -lung disease -other heart problems -thyroid disease -an unusual or allergic reaction to amiodarone, iodine, other medicines, foods, dyes, or preservatives -pregnant or trying to get pregnant -breast-feeding How should I use this medicine? Take this medicine by mouth with a glass of water. Follow the directions on the prescription label. You can take this medicine with or without food. However, you should always take it the same way each time. Take your doses at regular intervals. Do not take your medicine more often than directed. Do not stop taking except on the advice of your doctor or health care professional. A special MedGuide will be given to you by the pharmacist with each prescription and refill. Be sure to  read this information carefully each time. Talk to your pediatrician regarding the use of this medicine in children. Special care may be needed. Overdosage: If you think you have taken too much of this medicine contact a poison control center or emergency room at once. NOTE: This medicine is only for you. Do not share this medicine with others. What if I miss a dose? If you miss a dose, take it as soon as you can. If it is almost time for your next dose, take only that dose. Do not take double or extra doses. What may interact with this medicine? Do not take this medicine with any of the following medications: -abarelix -apomorphine -arsenic trioxide -certain antibiotics like erythromycin, gemifloxacin, levofloxacin, pentamidine -certain medicines for depression like amoxapine, tricyclic antidepressants -certain medicines for fungal infections like fluconazole, itraconazole, ketoconazole, posaconazole, voriconazole -certain medicines for irregular heart beat like disopyramide, dofetilide, dronedarone, ibutilide, propafenone, sotalol -certain medicines for malaria like chloroquine, halofantrine -cisapride -droperidol -haloperidol -hawthorn -maprotiline -methadone -phenothiazines like chlorpromazine, mesoridazine, thioridazine -pimozide -ranolazine -red yeast rice -vardenafil -ziprasidone This medicine may also interact with the following medications: -antiviral medicines for HIV or AIDS -certain medicines for blood pressure, heart disease, irregular heart beat -certain medicines for cholesterol like atorvastatin, cerivastatin, lovastatin, simvastatin -certain medicines for hepatitis C like sofosbuvir and ledipasvir; sofosbuvir -certain medicines for seizures like phenytoin -certain medicines for thyroid problems -certain medicines that treat or prevent blood clots like  warfarin -cholestyramine -cimetidine -clopidogrel -cyclosporine -dextromethorphan -diuretics -fentanyl -general anesthetics -grapefruit juice -lidocaine -loratadine -methotrexate -other medicines that prolong the QT interval (cause an abnormal  heart rhythm) -procainamide -quinidine -rifabutin, rifampin, or rifapentine -St. John's Wort -trazodone This list may not describe all possible interactions. Give your health care provider a list of all the medicines, herbs, non-prescription drugs, or dietary supplements you use. Also tell them if you smoke, drink alcohol, or use illegal drugs. Some items may interact with your medicine. What should I watch for while using this medicine? Your condition will be monitored closely when you first begin therapy. Often, this drug is first started in a hospital or other monitored health care setting. Once you are on maintenance therapy, visit your doctor or health care professional for regular checks on your progress. Because your condition and use of this medicine carry some risk, it is a good idea to carry an identification card, necklace or bracelet with details of your condition, medications, and doctor or health care professional. Dennis Bast may get drowsy or dizzy. Do not drive, use machinery, or do anything that needs mental alertness until you know how this medicine affects you. Do not stand or sit up quickly, especially if you are an older patient. This reduces the risk of dizzy or fainting spells. This medicine can make you more sensitive to the sun. Keep out of the sun. If you cannot avoid being in the sun, wear protective clothing and use sunscreen. Do not use sun lamps or tanning beds/booths. You should have regular eye exams before and during treatment. Call your doctor if you have blurred vision, see halos, or your eyes become sensitive to light. Your eyes may get dry. It may be helpful to use a lubricating eye solution or artificial tears  solution. If you are going to have surgery or a procedure that requires contrast dyes, tell your doctor or health care professional that you are taking this medicine. What side effects may I notice from receiving this medicine? Side effects that you should report to your doctor or health care professional as soon as possible: -allergic reactions like skin rash, itching or hives, swelling of the face, lips, or tongue -blue-gray coloring of the skin -blurred vision, seeing blue green halos, increased sensitivity of the eyes to light -breathing problems -chest pain -dark urine -fast, irregular heartbeat -feeling faint or light-headed -intolerance to heat or cold -nausea or vomiting -pain and swelling of the scrotum -pain, tingling, numbness in feet, hands -redness, blistering, peeling or loosening of the skin, including inside the mouth -spitting up blood -stomach pain -sweating -unusual or uncontrolled movements of body -unusually weak or tired -weight gain or loss -yellowing of the eyes or skin Side effects that usually do not require medical attention (report to your doctor or health care professional if they continue or are bothersome): -change in sex drive or performance -constipation -dizziness -headache -loss of appetite -trouble sleeping This list may not describe all possible side effects. Call your doctor for medical advice about side effects. You may report side effects to FDA at 1-800-FDA-1088. Where should I keep my medicine? Keep out of the reach of children. Store at room temperature between 20 and 25 degrees C (68 and 77 degrees F). Protect from light. Keep container tightly closed. Throw away any unused medicine after the expiration date. NOTE: This sheet is a summary. It may not cover all possible information. If you have questions about this medicine, talk to your doctor, pharmacist, or health care provider.  2018 Elsevier/Gold Standard (2013-07-17 19:48:11)

## 2018-05-09 ENCOUNTER — Other Ambulatory Visit: Payer: Self-pay | Admitting: Cardiology

## 2018-05-09 MED ORDER — POTASSIUM CHLORIDE CRYS ER 20 MEQ PO TBCR: 20.0000 meq | EXTENDED_RELEASE_TABLET | Freq: Every day | ORAL | 3 refills | Status: DC

## 2018-05-09 NOTE — Telephone Encounter (Signed)
° ° ° ° °*  STAT* If patient is at the pharmacy, call can be transferred to refill team.   1. Which medications need to be refilled? (please list name of each medication and dose if known) potassium chloride SA (K-DUR,KLOR-CON) 20 MEQ tablet  2. Which pharmacy/location (including street and city if local pharmacy) is medication to be sent to? [id number] - [place], [address] AT [address]  3. Do they need a 30 day or 90 day supply? [place]

## 2018-05-09 NOTE — Telephone Encounter (Signed)
Pt's medication was sent to pt's pharmacy as requested. Confirmation received.  °

## 2018-05-22 NOTE — Progress Notes (Addendum)
Cardiology Office Note:    Date:  05/23/2018   ID:  Gary Morse, DOB Nov 29, 1934, MRN [id number]  PCP:  [name], MD  Cardiologist:  Sinclair Grooms, MD   Referring MD: [name], MD   Chief Complaint  Patient presents with  . Congestive Heart Failure    History of Present Illness:    Gary Morse is a 84 y.o. male with a hx of chronic systolic and diastolic heart failure felt secondary to hypertension, frequent PVCs, and recently noted decline in LV function EF less than25-30%July 2019.  Mr. Gary Morse is accompanied by his son.  Within several days of starting amiodarone, he began feeling better.  His exertional tolerance has improved.  He does not feel palpitations.  He has had no outward side effects to amiodarone therapy.  He specifically has not had syncope or prolonged tachycardia.  No interval orthopnea, PND, or peripheral edema.  Exertional tolerance is improved.  He denies angina pectoris.  He had electrophysiology consultation by Dr. Lovena Le who felt he was not a candidate for defibrillator therapy and who started amiodarone to suppress increase burden of PVCs.  A concern was that he could have ventricular ectopy induced cardiomyopathy.  No recent repeat echo.  No amiodarone therapy follow-up labs.  Baseline labs on thyroid and liver function from July 2019 were normal.  Past Medical History:  Diagnosis Date  . Adenomatous polyp    POSITIVE, COLON 7/18, [place]UR[place] COLON NEGATIVE  . Allergic rhinitis   . BPH (benign prostatic hyperplasia)    MICROSCOPIC HEMATURIA WORKUP IN 2004 WITH UROLOGY NEGATIVE, DR. Karsten Ro  . CHF (congestive heart failure) ([place])    EPISODE IN 1994 SECONDARY TO HYPERTENSION, ECHO 2005 NORMAL LV FUNCTION  . CKD (chronic kidney disease)    STAGE 2 MICROALBUMIN POSITIVE  . Colon polyp    COLON IN 2018  . COPD (chronic obstructive pulmonary disease) ([place])    ON X-RAY, CIGAR USE--PFT IN 2013 NORMAL  . Diabetes type 2, controlled  ([place])   . Dyslipidemia   . Dyspnea   . ED (erectile dysfunction)   . Hypertension   . Patellar bursitis of right knee    PRE-PATELLA BURSITIS WITH INTERMITTENT SWELLING     Past Surgical History:  Procedure Laterality Date  . COLONOSCOPY  12/2010   10/2016  . INGUINAL HERNIA REPAIR Right    1976    Current Medications: Current Meds  Medication Sig  . amiodarone (PACERONE) 200 MG tablet Take 1 tablet (200 mg total) by mouth daily.  Marland Kitchen aspirin EC 81 MG tablet Take 81 mg by mouth daily.  . carvedilol (COREG) 6.25 MG tablet Take 1 tablet (6.25 mg total) by mouth 2 (two) times daily with a meal.  . fluticasone (FLONASE) 50 MCG/ACT nasal spray Place 1 spray into both nostrils daily as needed for allergies.   . furosemide (LASIX) 40 MG tablet Take one tablet by mouth twice daily.  You may take an additional tablet if you gain 2lbs or more.  Marland Kitchen glimepiride (AMARYL) 1 MG tablet Take 1 mg by mouth daily with breakfast.  . potassium chloride SA (K-DUR,KLOR-CON) 20 MEQ tablet Take 1 tablet (20 mEq total) by mouth daily.  . sacubitril-valsartan (ENTRESTO) 49-51 MG Take 1 tablet by mouth 2 (two) times daily.  . SYMBICORT 80-4.5 MCG/ACT inhaler Inhale 2 puffs into the lungs 2 (two) times daily.     Allergies:   Metformin and related and Penicillins   Social History   Socioeconomic History  .  Marital status: Married    Spouse name: Not on file  . Number of children: 7  . Years of education: Not on file  . Highest education level: Not on file  Occupational History  . Occupation: CARPENTER  Social Needs  . Financial resource strain: Not on file  . Food insecurity:    Worry: Not on file    Inability: Not on file  . Transportation needs:    Medical: Not on file    Non-medical: Not on file  Tobacco Use  . Smoking status: Current Every Day Smoker    Types: Cigars  . Smokeless tobacco: Never Used  Substance and Sexual Activity  . Alcohol use: Yes    Comment: OCCASIONAL  . Drug use:  Never  . Sexual activity: Not on file  Lifestyle  . Physical activity:    Days per week: Not on file    Minutes per session: Not on file  . Stress: Not on file  Relationships  . Social connections:    Talks on phone: Not on file    Gets together: Not on file    Attends religious service: Not on file    Active member of club or organization: Not on file    Attends meetings of clubs or organizations: Not on file    Relationship status: Not on file  Other Topics Concern  . Not on file  Social History Narrative  . Not on file     Family History: The patient's family history includes CAD in his father; Congestive Heart Failure in his sister; Congestive Heart Failure (age of onset: 49) in his mother; Heart attack (age of onset: 71) in his father; Heart failure in his sister; Kidney failure in his brother; Other in his brother; Throat cancer in his brother. There is no history of Colon cancer, Colon polyps, or Liver disease.  ROS:   Please see the history of present illness.    He is doing okay from the standpoint of heart.  He has a chronic cough.  He denies cigarette smoking.  He is sleeping well.  He is concerned about his lower extremities because the nurse from his insurance company says that he has decreased strength in his legs.  A particular test was done but he cannot give me details.  All other systems reviewed and are negative.  EKGs/Labs/Other Studies Reviewed:    The following studies were reviewed today: No new data.   EKG:  EKG not performed on today's exam  Recent Labs: 11/24/2017: ALT 51; B Natriuretic Peptide 1,832.7; TSH 1.370 01/13/2018: Hemoglobin 16.3; Platelets 277 03/01/2018: BUN 12; Creatinine, Ser 1.25; NT-Pro BNP 2,804; Potassium 4.2; Sodium 143  Recent Lipid Panel No results found for: CHOL, TRIG, HDL, CHOLHDL, VLDL, LDLCALC, LDLDIRECT  Physical Exam:    VS:  BP (!) 138/98   Pulse 90   Ht 5\' 8"  (1.727 m)   Wt 194 lb 9.6 oz (88.3 kg)   SpO2 96%   BMI  29.59 kg/m     Wt Readings from Last 3 Encounters:  05/23/18 194 lb 9.6 oz (88.3 kg)  04/04/18 192 lb (87.1 kg)  03/01/18 193 lb 12.8 oz (87.9 kg)     GEN: Appears younger than stated age, but clearly elderly.. No acute distress HEENT: Normal NECK: No JVD. LYMPHATICS: No lymphadenopathy CARDIAC: RRR.  No murmur, no gallop, no edema VASCULAR: 2+ bilateral radial and carotid pulses, no bruits RESPIRATORY:  Clear to auscultation without rales, wheezing or rhonchi  ABDOMEN: Soft, non-tender, non-distended, No pulsatile mass, MUSCULOSKELETAL: No deformity  SKIN: Warm and dry NEUROLOGIC:  Alert and oriented x 3 PSYCHIATRIC:  Normal affect   ASSESSMENT:    1. Chronic systolic heart failure ([place])   2. Hypertensive heart disease with heart failure ([place])   3. Essential hypertension   4. RBBB   5. Frequent PVCs    PLAN:    In order of problems listed above:  1. Clinically doing well.  No evidence of volume overload.  With systolic heart failure, we should probably consider adding an SGLT2 to care for the protection against heart failure development.  He is currently on guideline directed therapy for heart failure that includes intermediate dose carvedilol and mid range dose Entresto.  Perhaps dapagliflozin by empagliflozin would be appropriate.  Will discuss with PCP. 2. Blood pressure is adequately controlled. 3. excellently controlled currently.  Target 130/80 mmHg. 4. EKG not performed on today's visit. 5. Clinically, PVCs appear to be significantly improved on low-dose amiodarone.  We will leave further evaluation of this issue to Dr. Lovena Le who plans to see the patient again in a couple months.?  Repeat 24-hour Holter.?  Long-term amiodarone therapy given positive clinical response. 6. Will reach out to PCP and our pharmacy clinic to determine if SGLT2 therapy will be appropriate.  Guideline directed therapy for left ventricular systolic dysfunction: Angiotensin receptor-neprilysin  inhibitor (ARNI)-Entresto; beta-blocker therapy - carvedilol or metoprolol succinate; mineralocorticoid receptor antagonist (MRA) therapy -spironolactone or eplerenone.  These therapies have been shown to improve clinical outcomes including reduction of rehospitalization survival, and acute heart failure.  Clinical follow-up with me in 6 months.  Consider repeat echocardiogram at some point this year.  Repeat monitor based upon recommendations of Dr. Lovena Le.  Will need to have liver and lipid panel done at least by the next time I see him in the office if not done earlier by Dr. Lovena Le.  8-month clinical follow-up  Medication Adjustments/Labs and Tests Ordered: Current medicines are reviewed at length with the patient today.  Concerns regarding medicines are outlined above.  No orders of the defined types were placed in this encounter.  No orders of the defined types were placed in this encounter.   There are no Patient Instructions on file for this visit.   Signed, Sinclair Grooms, MD  05/23/2018 8:14 AM    [place]

## 2018-05-23 ENCOUNTER — Telehealth: Payer: Self-pay

## 2018-05-23 ENCOUNTER — Ambulatory Visit: Payer: Medicare Other | Admitting: Interventional Cardiology

## 2018-05-23 ENCOUNTER — Encounter: Payer: Self-pay | Admitting: Interventional Cardiology

## 2018-05-23 VITALS — BP 138/98 | HR 90 | Ht 68.0 in | Wt 194.6 lb

## 2018-05-23 DIAGNOSIS — I110 Hypertensive heart disease with heart failure: Secondary | ICD-10-CM

## 2018-05-23 DIAGNOSIS — I10 Essential (primary) hypertension: Secondary | ICD-10-CM

## 2018-05-23 DIAGNOSIS — I4510 Unspecified right bundle-branch block: Secondary | ICD-10-CM | POA: Diagnosis not present

## 2018-05-23 DIAGNOSIS — E11638 Type 2 diabetes mellitus with other oral complications: Secondary | ICD-10-CM

## 2018-05-23 DIAGNOSIS — I493 Ventricular premature depolarization: Secondary | ICD-10-CM

## 2018-05-23 DIAGNOSIS — I5022 Chronic systolic (congestive) heart failure: Secondary | ICD-10-CM

## 2018-05-23 NOTE — Patient Instructions (Signed)
Medication Instructions:  Your physician recommends that you continue on your current medications as directed. Please refer to the Current Medication list given to you today.  If you need a refill on your cardiac medications before your next appointment, please call your pharmacy.   Lab work: Your physician recommends that you have lab work at your next visit: TSH, LFTs  If you have labs (blood work) drawn today and your tests are completely normal, you will receive your results only by: Marland Kitchen MyChart Message (if you have MyChart) OR . A paper copy in the mail If you have any lab test that is abnormal or we need to change your treatment, we will call you to review the results.  Testing/Procedures: None ordered  Follow-Up: At [facility], you and your health needs are our priority.  As part of our continuing mission to provide you with exceptional heart care, we have created designated Provider Care Teams.  These Care Teams include your primary Cardiologist (physician) and Advanced Practice Providers (APPs -  Physician Assistants and Nurse Practitioners) who all work together to provide you with the care you need, when you need it. . You will need a follow up appointment in 6 months.  Please call our office 2 months in advance to schedule this appointment.  You may see Sinclair Grooms, MD or one of the following Advanced Practice Providers on your designated Care Team:   . Truitt Merle, NP . Cecilie Kicks, NP . Kathyrn Drown, NP   Any Other Special Instructions Will Be Listed Below (If Applicable).

## 2018-05-23 NOTE — Telephone Encounter (Signed)
Patient meets inclusion criteria for current pharmacy residency project to initiate SGLT2i or GLP1-RA therapy for cardiovascular benefit due to current diagnosis of HF and DM.  Patient was identified for enrollment by Dr. Tamala Julian on 05/23/18.   Pharmacist called to discuss initiation of Jardiance 10mg  and enrollment into study with patient. Patient declined as he prefers his PCP to manage is diabetes. Patient aware of the medication and potential cardiovascular benefits and remains interested to start therapy. Patient states he will call to discuss initiation with his PCP tomorrow.   Relevant labs and dates: No results found for: HGBA1C - 6.7 per [facility] lab values  Lab Results  Component Value Date   CREATININE 1.25 03/01/2018   CREATININE 1.19 01/20/2018   CREATININE 1.18 01/13/2018   CrCl cannot be calculated (Patient's most recent lab result is older than the maximum 21 days allowed.). Wt Readings from Last 1 Encounters:  05/23/18 194 lb 9.6 oz (88.3 kg)   BP Readings from Last 1 Encounters:  05/23/18 (!) 138/98    Metformin use: allergy listed   Isaias Sakai, Pharm D PGY1 Pharmacy Resident   05/23/2018      4:18 PM

## 2018-07-12 ENCOUNTER — Telehealth: Payer: Self-pay

## 2018-07-12 NOTE — Telephone Encounter (Signed)
Call placed to Pt.  Advised at this time-routine follow up appointments are being cancelled to reduce possible exposure for our patients.  Per Pt-feels great since his first dose of amiodarone.  Advised appointment would be cancelled and Pt would be contacted to reschedule.  Advised to call office if any needs.

## 2018-07-13 ENCOUNTER — Ambulatory Visit: Payer: Medicare Other | Admitting: Internal Medicine

## 2018-10-20 ENCOUNTER — Other Ambulatory Visit: Payer: Self-pay | Admitting: Interventional Cardiology

## 2019-01-16 ENCOUNTER — Other Ambulatory Visit: Payer: Self-pay | Admitting: Internal Medicine

## 2019-02-20 ENCOUNTER — Other Ambulatory Visit: Payer: Self-pay | Admitting: Internal Medicine

## 2019-03-20 ENCOUNTER — Telehealth: Payer: Self-pay | Admitting: Interventional Cardiology

## 2019-03-20 MED ORDER — SACUBITRIL-VALSARTAN 49-51 MG PO TABS: 1.0000 | ORAL_TABLET | Freq: Two times a day (BID) | ORAL | 0 refills | Status: DC

## 2019-03-20 NOTE — Telephone Encounter (Signed)
Pt's medication was sent to pt's pharmacy as requested. Confirmation received.  °

## 2019-03-20 NOTE — Progress Notes (Signed)
PCP:  [name], MD Primary Cardiologist: Sinclair Grooms, MD Electrophysiologist: Dr. Elenore Rota is a 84 y.o. male with past medical history of chronic systolic CHF, PVCs, and HTN who presents today for routine electrophysiology followup. They are seen for Dr. Lovena Le.   Since last being seen in our clinic, the patient reports doing well.  He states he was out of his Delene Loll for about 3 weeks, and restarted it Monday.  It was > $100 for a month supply, and they originally wanted >$400 for a 3 month supply.  He says this is not sustainable.  BP slightly elevated today, but he is already having holiday meals including chitterlings. Denies chest pain, palpitations, lightheadedness or dizziness. No SOB.     The patient feels that he is tolerating medications without difficulties and is otherwise without complaint today.   Past Medical History:  Diagnosis Date  . Adenomatous polyp    POSITIVE, COLON 7/18, COLOGAURD COLON NEGATIVE  . Allergic rhinitis   . BPH (benign prostatic hyperplasia)    MICROSCOPIC HEMATURIA WORKUP IN 2004 WITH UROLOGY NEGATIVE, DR. Karsten Ro  . CHF (congestive heart failure) ([place])    EPISODE IN 1994 SECONDARY TO HYPERTENSION, ECHO 2005 NORMAL LV FUNCTION  . CKD (chronic kidney disease)    STAGE 2 MICROALBUMIN POSITIVE  . Colon polyp    COLON IN 2018  . COPD (chronic obstructive pulmonary disease) ([place])    ON X-RAY, CIGAR USE--PFT IN 2013 NORMAL  . Diabetes type 2, controlled ([place])   . Dyslipidemia   . Dyspnea   . ED (erectile dysfunction)   . Hypertension   . Patellar bursitis of right knee    PRE-PATELLA BURSITIS WITH INTERMITTENT SWELLING    Past Surgical History:  Procedure Laterality Date  . COLONOSCOPY  12/2010   10/2016  . INGUINAL HERNIA REPAIR Right    1976    Current Outpatient Medications  Medication Sig Dispense Refill  . amiodarone (PACERONE) 200 MG tablet TAKE 1 TABLET BY MOUTH EVERY DAY 30 tablet 0  . aspirin EC 81 MG  tablet Take 81 mg by mouth daily.    . carvedilol (COREG) 6.25 MG tablet Take 1 tablet (6.25 mg total) by mouth 2 (two) times daily with a meal. 180 tablet 3  . fluticasone (FLONASE) 50 MCG/ACT nasal spray Place 1 spray into both nostrils daily as needed for allergies.     . furosemide (LASIX) 40 MG tablet TAKE 1 TABLET BY MOUTH TWICE DAILY. TAKE AN ADDITIONAL TABLET IF YOU GAIN 2 LBS OR MORE. 210 tablet 1  . glimepiride (AMARYL) 1 MG tablet Take 1 mg by mouth daily with breakfast.    . potassium chloride SA (K-DUR,KLOR-CON) 20 MEQ tablet Take 1 tablet (20 mEq total) by mouth daily. 90 tablet 3  . sacubitril-valsartan (ENTRESTO) 49-51 MG Take 1 tablet by mouth 2 (two) times daily. Please keep upcoming appt for future refills. Thank you 180 tablet 0  . SYMBICORT 80-4.5 MCG/ACT inhaler Inhale 2 puffs into the lungs 2 (two) times daily.  12   No current facility-administered medications for this visit.     Allergies  Allergen Reactions  . Metformin And Related Other (See Comments)    Unknown reaction  . Penicillins Other (See Comments)    Welps developed. Has patient had a PCN reaction causing immediate rash, facial/tongue/throat swelling, SOB or lightheadedness with hypotension: No Has patient had a PCN reaction causing severe rash involving mucus membranes or skin necrosis: No  Has patient had a PCN reaction that required hospitalization: No Has patient had a PCN reaction occurring within the last 10 years: No If all of the above answers are "NO", then may proceed with Cephalosporin use.       Social History   Socioeconomic History  . Marital status: Married    Spouse name: Not on file  . Number of children: 7  . Years of education: Not on file  . Highest education level: Not on file  Occupational History  . Occupation: CARPENTER  Social Needs  . Financial resource strain: Not on file  . Food insecurity    Worry: Not on file    Inability: Not on file  . Transportation needs     Medical: Not on file    Non-medical: Not on file  Tobacco Use  . Smoking status: Current Every Day Smoker    Types: Cigars  . Smokeless tobacco: Never Used  Substance and Sexual Activity  . Alcohol use: Yes    Comment: OCCASIONAL  . Drug use: Never  . Sexual activity: Not on file  Lifestyle  . Physical activity    Days per week: Not on file    Minutes per session: Not on file  . Stress: Not on file  Relationships  . Social Herbalist on phone: Not on file    Gets together: Not on file    Attends religious service: Not on file    Active member of club or organization: Not on file    Attends meetings of clubs or organizations: Not on file    Relationship status: Not on file  . Intimate partner violence    Fear of current or ex partner: Not on file    Emotionally abused: Not on file    Physically abused: Not on file    Forced sexual activity: Not on file  Other Topics Concern  . Not on file  Social History Narrative  . Not on file     Review of Systems: General: No chills, fever, night sweats or weight changes  Cardiovascular:  No chest pain, dyspnea on exertion, edema, orthopnea, palpitations, paroxysmal nocturnal dyspnea Dermatological: No rash, lesions or masses Respiratory: No cough, dyspnea Urologic: No hematuria, dysuria Abdominal: No nausea, vomiting, diarrhea, bright red blood per rectum, melena, or hematemesis Neurologic: No visual changes, weakness, changes in mental status All other systems reviewed and are otherwise negative except as noted above.  Physical Exam: There were no vitals filed for this visit.  GEN- The patient is well appearing, alert and oriented x 3 today.   HEENT: normocephalic, atraumatic; sclera clear, conjunctiva pink; hearing intact; oropharynx clear; neck supple, no JVP Lymph- no cervical lymphadenopathy Lungs- Clear to ausculation bilaterally, normal work of breathing.  No wheezes, rales, rhonchi Heart- Regular rate and  rhythm, no murmurs, rubs or gallops, PMI not laterally displaced GI- soft, non-tender, non-distended, bowel sounds present, no hepatosplenomegaly Extremities- no clubbing, cyanosis, or edema; DP/PT/radial pulses 2+ bilaterally MS- no significant deformity or atrophy Skin- warm and dry, no rash or lesion Psych- euthymic mood, full affect Neuro- strength and sensation are intact  EKG is ordered. Personal review of EKG from today shows NSR at 74 bpm, PR 230 ms, QRS 116 ms, occasional PVCs.   Assessment and Plan:  1. PVCs Stable on amiodarone 200 mg daily.  Check surveillance labs today.  He missed his yearly eye exam and is currently scheduled for July 2021. I encouraged him to move  this up.   2. Chronic systolic CHF Echo [id number] LVEF 15-20% Continue optimal GDMT Poor candidate for ICD given advanced age and co-morbidities Increase coreg to 12.5 mg BID Continue Entresto 49/51 mg BID. This is costly for me, and may need to consider switch to Losartan.  With BP elevated, would likely benefit from addition of hydralazine/imdur. Consider low dose spironolactone based on labs. Will not add today with potential change to [facility], and not to make too many changes at once for patient.   3. HTN Meds as above.   RTC 6 months to see Dr. Lovena Le. Sooner with symptoms.   Shirley Friar, PA-C  03/20/19 2:23 PM

## 2019-03-20 NOTE — Telephone Encounter (Signed)
° ° ° °*  STAT* If patient is at the pharmacy, call can be transferred to refill team.   1. Which medications need to be refilled? (please list name of each medication and dose if known) sacubitril-valsartan (ENTRESTO) 49-51 MG  2. Which pharmacy/location (including street and city if local pharmacy) is medication to be sent to? [id number] - [place], [place] - [address]. AT [address]  3. Do they need a 30 day or 90 day supply? [place]

## 2019-03-22 ENCOUNTER — Other Ambulatory Visit: Payer: Self-pay

## 2019-03-22 ENCOUNTER — Ambulatory Visit: Payer: Medicare Other | Admitting: Student

## 2019-03-22 VITALS — BP 155/100 | HR 74 | Ht 68.0 in | Wt 199.0 lb

## 2019-03-22 DIAGNOSIS — I5022 Chronic systolic (congestive) heart failure: Secondary | ICD-10-CM

## 2019-03-22 DIAGNOSIS — I110 Hypertensive heart disease with heart failure: Secondary | ICD-10-CM

## 2019-03-22 DIAGNOSIS — I493 Ventricular premature depolarization: Secondary | ICD-10-CM | POA: Diagnosis not present

## 2019-03-22 DIAGNOSIS — I10 Essential (primary) hypertension: Secondary | ICD-10-CM

## 2019-03-22 LAB — CBC
Hematocrit: 50.9 % (ref 37.5–51.0)
Hemoglobin: 17.1 g/dL (ref 13.0–17.7)
MCH: 27.4 pg (ref 26.6–33.0)
MCHC: 33.6 g/dL (ref 31.5–35.7)
MCV: 82 fL (ref 79–97)
Platelets: 321 10*3/uL (ref 150–450)
RBC: 6.24 x10E6/uL — ABNORMAL HIGH (ref 4.14–5.80)
RDW: 14.4 % (ref 11.6–15.4)
WBC: 7.7 10*3/uL (ref 3.4–10.8)

## 2019-03-22 LAB — COMPREHENSIVE METABOLIC PANEL
ALT: 13 IU/L (ref 0–44)
AST: 15 IU/L (ref 0–40)
Albumin/Globulin Ratio: 1.1 — ABNORMAL LOW (ref 1.2–2.2)
Albumin: 4 g/dL (ref 3.6–4.6)
Alkaline Phosphatase: 106 IU/L (ref 39–117)
BUN/Creatinine Ratio: 11 (ref 10–24)
BUN: 15 mg/dL (ref 8–27)
Bilirubin Total: 0.4 mg/dL (ref 0.0–1.2)
CO2: 25 mmol/L (ref 20–29)
Calcium: 9.3 mg/dL (ref 8.6–10.2)
Chloride: 101 mmol/L (ref 96–106)
Creatinine, Ser: 1.32 mg/dL — ABNORMAL HIGH (ref 0.76–1.27)
GFR calc Af Amer: 58 mL/min/{1.73_m2} — ABNORMAL LOW (ref >59)
GFR calc non Af Amer: 50 mL/min/{1.73_m2} — ABNORMAL LOW (ref >59)
Globulin, Total: 3.5 g/dL (ref 1.5–4.5)
Glucose: 112 mg/dL — ABNORMAL HIGH (ref 65–99)
Potassium: 4.7 mmol/L (ref 3.5–5.2)
Sodium: 140 mmol/L (ref 134–144)
Total Protein: 7.5 g/dL (ref 6.0–8.5)

## 2019-03-22 LAB — TSH: TSH: 5.23 u[IU]/mL — ABNORMAL HIGH (ref 0.450–4.500)

## 2019-03-22 MED ORDER — CARVEDILOL 12.5 MG PO TABS: 6.2500 mg | ORAL_TABLET | Freq: Two times a day (BID) | ORAL | 3 refills | Status: DC

## 2019-03-22 NOTE — Patient Instructions (Signed)
Medication Instructions:   START TAKING: COREG 12.5 MG TWICE A DAY   *If you need a refill on your cardiac medications before your next appointment, please call your pharmacy*  Lab Work:  CMET TSH CBC  TODAY   If you have labs (blood work) drawn today and your tests are completely normal, you will receive your results only by: Marland Kitchen MyChart Message (if you have MyChart) OR . A paper copy in the mail If you have any lab test that is abnormal or we need to change your treatment, we will call you to review the results.  Testing/Procedures: NONE ORDERED  TODAY    Follow-Up: At [facility], you and your health needs are our priority.  As part of our continuing mission to provide you with exceptional heart care, we have created designated Provider Care Teams.  These Care Teams include your primary Cardiologist (physician) and Advanced Practice Providers (APPs -  Physician Assistants and Nurse Practitioners) who all work together to provide you with the care you need, when you need it.  Your next appointment:   6 month(s)  The format for your next appointment:   In Person  Provider:   You may see   Dr. Lovena Le or one of the following Advanced Practice Providers on your designated Care Team:    Chanetta Marshall, NP  Tommye Standard, PA-C  Legrand Como "[place], [place]   Other Instructions

## 2019-03-25 ENCOUNTER — Other Ambulatory Visit: Payer: Self-pay | Admitting: Interventional Cardiology

## 2019-03-27 ENCOUNTER — Other Ambulatory Visit: Payer: Self-pay | Admitting: *Deleted

## 2019-03-27 DIAGNOSIS — I5022 Chronic systolic (congestive) heart failure: Secondary | ICD-10-CM

## 2019-03-31 ENCOUNTER — Other Ambulatory Visit: Payer: Self-pay

## 2019-03-31 ENCOUNTER — Other Ambulatory Visit: Payer: Medicare Other | Admitting: *Deleted

## 2019-03-31 DIAGNOSIS — I5022 Chronic systolic (congestive) heart failure: Secondary | ICD-10-CM

## 2019-03-31 LAB — T3, FREE: T3, Free: 2.9 pg/mL (ref 2.0–4.4)

## 2019-03-31 LAB — T4, FREE: Free T4: 1.45 ng/dL (ref 0.82–1.77)

## 2019-03-31 LAB — TSH: TSH: 5.18 u[IU]/mL — ABNORMAL HIGH (ref 0.450–4.500)

## 2019-04-03 ENCOUNTER — Telehealth: Payer: Self-pay

## 2019-04-03 NOTE — Telephone Encounter (Signed)
-----   Message from Shirley Friar, PA-C sent at 04/03/2019  8:44 AM EST ----- Per Dr. Lovena Le should see PCP to see if thyroid levels require treatment or further monitoring.   Legrand Como [address]" [place], PA-C  04/03/2019 8:44 AM

## 2019-04-03 NOTE — Telephone Encounter (Signed)
Notes recorded by Frederik Schmidt, RN on 04/03/2019 at 1:09 PM EST  The patient has been notified of the result and verbalized understanding. All questions (if any) were answered.  Frederik Schmidt, RN 04/03/2019 1:09 PM

## 2019-04-03 NOTE — Telephone Encounter (Signed)
-----   Message from Shirley Friar, PA-C sent at 04/03/2019  8:44 AM EST ----- Per Dr. Lovena Le should see PCP to see if thyroid levels require treatment or further monitoring.   Legrand Como [address]" [place], PA-C  04/03/2019 8:44 AM

## 2019-04-03 NOTE — Telephone Encounter (Signed)
Notes recorded by Frederik Schmidt, RN on 04/03/2019 at 9:47 AM EST  Patient not available, will reach out this afternoon.  ------

## 2019-05-01 ENCOUNTER — Other Ambulatory Visit (HOSPITAL_COMMUNITY): Payer: Self-pay

## 2019-05-01 ENCOUNTER — Telehealth (HOSPITAL_COMMUNITY): Payer: Self-pay

## 2019-05-01 MED ORDER — CARVEDILOL 6.25 MG PO TABS: 6.2500 mg | ORAL_TABLET | Freq: Two times a day (BID) | ORAL | 5 refills | Status: DC

## 2019-05-01 NOTE — Telephone Encounter (Signed)
Pt requesting refill of Carvedilol.  Confirmed pt currently taking 6.25mg  BID, requests 6.25mg  tab that pt does not have to cut in half.  Rx sent to pharmacy.

## 2019-06-09 ENCOUNTER — Other Ambulatory Visit: Payer: Self-pay | Admitting: Cardiology

## 2019-06-14 ENCOUNTER — Other Ambulatory Visit: Payer: Self-pay | Admitting: Interventional Cardiology

## 2019-06-20 NOTE — Progress Notes (Signed)
Cardiology Office Note:    Date:  06/21/2019   ID:  Gary Morse, DOB [birth year], MRN [id number]  PCP:  [name], MD  Cardiologist:  Sinclair Grooms, MD   Referring MD: [name], MD   Chief Complaint  Patient presents with  . Congestive Heart Failure    History of Present Illness:    Gary Morse is a 85 y.o. male with a hx of chronic systolic and diastolic heart failure felt secondary to hypertension, frequent PVCs, and recently noted decline in LV function EF less than25-30%July 2019.  He has no complaints.  He has not taken his medication yet today.  He does not take it if he is going out because it causes him to urinate too much.  He is able to lie flat.  There is no peripheral edema.  He has not had palpitations or syncope.  No episodes of angina have occurred.  States he is compliant with his medication therapy.  Past Medical History:  Diagnosis Date  . Adenomatous polyp    POSITIVE, COLON 7/18, COLOGAURD COLON NEGATIVE  . Allergic rhinitis   . BPH (benign prostatic hyperplasia)    MICROSCOPIC HEMATURIA WORKUP IN 2004 WITH UROLOGY NEGATIVE, DR. Karsten Ro  . CHF (congestive heart failure) ([place])    EPISODE IN 1994 SECONDARY TO HYPERTENSION, ECHO 2005 NORMAL LV FUNCTION  . CKD (chronic kidney disease)    STAGE 2 MICROALBUMIN POSITIVE  . Colon polyp    COLON IN 2018  . COPD (chronic obstructive pulmonary disease) ([place])    ON X-RAY, CIGAR USE--PFT IN 2013 NORMAL  . Diabetes type 2, controlled ([place])   . Dyslipidemia   . Dyspnea   . ED (erectile dysfunction)   . Hypertension   . Patellar bursitis of right knee    PRE-PATELLA BURSITIS WITH INTERMITTENT SWELLING     Past Surgical History:  Procedure Laterality Date  . COLONOSCOPY  12/2010   10/2016  . INGUINAL HERNIA REPAIR Right    1976    Current Medications: Current Meds  Medication Sig  . amiodarone (PACERONE) 200 MG tablet TAKE 1 TABLET BY MOUTH EVERY DAY  . aspirin EC 81 MG tablet Take  81 mg by mouth daily.  . carvedilol (COREG) 6.25 MG tablet Take 1 tablet (6.25 mg total) by mouth 2 (two) times daily with a meal.  . ENTRESTO 49-51 MG TAKE 1 TABLET BY MOUTH 2 (TWO) TIMES DAILY. PLEASE KEEP UPCOMING APPT FOR FUTURE REFILLS. THANK YOU  . fluticasone (FLONASE) 50 MCG/ACT nasal spray Place 1 spray into both nostrils daily as needed for allergies.   . furosemide (LASIX) 40 MG tablet TAKE 1 TABLET BY MOUTH TWICE DAILY. TAKE AN ADDITIONAL TABLET IF YOU GAIN 2 LBS OR MORE.  Marland Kitchen glimepiride (AMARYL) 1 MG tablet Take 1 mg by mouth daily with breakfast.  . levothyroxine (SYNTHROID) 50 MCG tablet Take 50 mcg by mouth every morning.  . potassium chloride SA (KLOR-CON M20) 20 MEQ tablet Take 1 tablet (20 mEq total) by mouth daily. Please make overdue appt with Dr.Ivannah Zody before anymore refills. 1st attempt  . [DISCONTINUED] levothyroxine (SYNTHROID) 25 MCG tablet Take 25 mcg by mouth every morning.     Allergies:   Metformin and related and Penicillins   Social History   Socioeconomic History  . Marital status: Married    Spouse name: Not on file  . Number of children: 7  . Years of education: Not on file  . Highest education level: Not on file  Occupational History  . Occupation: CARPENTER  Tobacco Use  . Smoking status: Current Every Day Smoker    Types: Cigars  . Smokeless tobacco: Never Used  Substance and Sexual Activity  . Alcohol use: Yes    Comment: OCCASIONAL  . Drug use: Never  . Sexual activity: Not on file  Other Topics Concern  . Not on file  Social History Narrative  . Not on file   Social Determinants of Health   Financial Resource Strain:   . Difficulty of Paying Living Expenses: Not on file  Food Insecurity:   . Worried About Charity fundraiser in the Last Year: Not on file  . Ran Out of Food in the Last Year: Not on file  Transportation Needs:   . Lack of Transportation (Medical): Not on file  . Lack of Transportation (Non-Medical): Not on file    Physical Activity:   . Days of Exercise per Week: Not on file  . Minutes of Exercise per Session: Not on file  Stress:   . Feeling of Stress : Not on file  Social Connections:   . Frequency of Communication with Friends and Family: Not on file  . Frequency of Social Gatherings with Friends and Family: Not on file  . Attends Religious Services: Not on file  . Active Member of Clubs or Organizations: Not on file  . Attends Archivist Meetings: Not on file  . Marital Status: Not on file     Family History: The patient's family history includes CAD in his father; Congestive Heart Failure in his sister; Congestive Heart Failure (age of onset: 24) in his mother; Heart attack (age of onset: 33) in his father; Heart failure in his sister; Kidney failure in his brother; Other in his brother; Throat cancer in his brother. There is no history of Colon cancer, Colon polyps, or Liver disease.  ROS:   Please see the history of present illness.    Appetite has been good.  He has had both doses of the COVID-19 vaccination.  All other systems reviewed and are negative.  EKGs/Labs/Other Studies Reviewed:    The following studies were reviewed today: Most recent echocardiogram done in 2019 revealed an EF of 30%.  EKG:  EKG most recent EKG performed in November 2020 demonstrates sinus rhythm, interventricular conduction delay, first-degree AV block.  Recent Labs: 03/22/2019: ALT 13; BUN 15; Creatinine, Ser 1.32; Hemoglobin 17.1; Platelets 321; Potassium 4.7; Sodium 140 03/31/2019: TSH 5.180  Recent Lipid Panel No results found for: CHOL, TRIG, HDL, CHOLHDL, VLDL, LDLCALC, LDLDIRECT  Physical Exam:    VS:  BP (!) 186/102   Pulse 75   Ht 5\' 8"  (1.727 m)   Wt 203 lb 6.4 oz (92.3 kg)   SpO2 97%   BMI 30.93 kg/m     Wt Readings from Last 3 Encounters:  06/21/19 203 lb 6.4 oz (92.3 kg)  03/22/19 199 lb (90.3 kg)  05/23/18 194 lb 9.6 oz (88.3 kg)     GEN: Elderly. No acute  distress HEENT: Normal NECK: No JVD. LYMPHATICS: No lymphadenopathy CARDIAC:  RRR without murmur, gallop, or edema. VASCULAR:  Normal Pulses. No bruits. RESPIRATORY:  Clear to auscultation without rales, wheezing or rhonchi  ABDOMEN: Soft, non-tender, non-distended, No pulsatile mass, MUSCULOSKELETAL: No deformity  SKIN: Warm and dry NEUROLOGIC:  Alert and oriented x 3 PSYCHIATRIC:  Normal affect   ASSESSMENT:    1. Chronic systolic heart failure ([place])   2. Hypertensive heart disease with  heart failure ([place])   3. Controlled type 2 diabetes mellitus with other oral complication, without long-term current use of insulin ([place])   4. PVC (premature ventricular contraction)   5. On amiodarone therapy   6. Educated about COVID-19 virus infection    PLAN:    In order of problems listed above:  1. Continue guideline directed therapy.  Echocardiogram in 5 to 6 months. 2. I believe blood pressure is elevated today because he has had no therapy.  He does not take his medications if he is going out because of frequent urination. 3. He needs to be on an SGLT2 agent given his history of heart failure.  Will message primary care concerning this. 4. Frequent PVCs are being treated with amiodarone. 5. Needs TSH and liver panel done in 6 months when he is followed up in the clinic.  An echo will be done at that time as well. 6. He has gotten the vaccine completed.  3W's is being practiced.  Guideline directed therapy for left ventricular systolic dysfunction: Angiotensin receptor-neprilysin inhibitor (ARNI)-Entresto; beta-blocker therapy - carvedilol or metoprolol succinate; mineralocorticoid receptor antagonist (MRA) therapy -spironolactone or eplerenone.  These therapies have been shown to improve clinical outcomes including reduction of rehospitalization survival, and acute heart failure.    Medication Adjustments/Labs and Tests Ordered: Current medicines are reviewed at length with the patient  today.  Concerns regarding medicines are outlined above.  Orders Placed This Encounter  Procedures  . ECHOCARDIOGRAM COMPLETE   No orders of the defined types were placed in this encounter.   Patient Instructions  Medication Instructions:  Your physician recommends that you continue on your current medications as directed. Please refer to the Current Medication list given to you today.  *If you need a refill on your cardiac medications before your next appointment, please call your pharmacy*  Lab Work: None If you have labs (blood work) drawn today and your tests are completely normal, you will receive your results only by: Marland Kitchen MyChart Message (if you have MyChart) OR . A paper copy in the mail If you have any lab test that is abnormal or we need to change your treatment, we will call you to review the results.  Testing/Procedures: Your physician has requested that you have an echocardiogram 1-2 weeks prior to seeing Dr. Tamala Julian back in 6 months. Echocardiography is a painless test that uses sound waves to create images of your heart. It provides your doctor with information about the size and shape of your heart and how well your heart's chambers and valves are working. This procedure takes approximately one hour. There are no restrictions for this procedure.    Follow-Up: At [facility], you and your health needs are our priority.  As part of our continuing mission to provide you with exceptional heart care, we have created designated Provider Care Teams.  These Care Teams include your primary Cardiologist (physician) and Advanced Practice Providers (APPs -  Physician Assistants and Nurse Practitioners) who all work together to provide you with the care you need, when you need it.  Your next appointment:   6 month(s)  The format for your next appointment:   In Person  Provider:   You may see Sinclair Grooms, MD or one of the following Advanced Practice Providers on your  designated Care Team:    Truitt Merle, NP  Cecilie Kicks, NP  Kathyrn Drown, NP   Other Instructions      Signed, [place],  MD  06/21/2019 8:37 AM    [facility]

## 2019-06-21 ENCOUNTER — Encounter: Payer: Self-pay | Admitting: Interventional Cardiology

## 2019-06-21 ENCOUNTER — Ambulatory Visit: Payer: Medicare Other | Admitting: Interventional Cardiology

## 2019-06-21 ENCOUNTER — Other Ambulatory Visit: Payer: Self-pay

## 2019-06-21 VITALS — BP 186/102 | HR 75 | Ht 68.0 in | Wt 203.4 lb

## 2019-06-21 DIAGNOSIS — Z79899 Other long term (current) drug therapy: Secondary | ICD-10-CM

## 2019-06-21 DIAGNOSIS — I493 Ventricular premature depolarization: Secondary | ICD-10-CM

## 2019-06-21 DIAGNOSIS — I110 Hypertensive heart disease with heart failure: Secondary | ICD-10-CM | POA: Diagnosis not present

## 2019-06-21 DIAGNOSIS — E11638 Type 2 diabetes mellitus with other oral complications: Secondary | ICD-10-CM

## 2019-06-21 DIAGNOSIS — Z7189 Other specified counseling: Secondary | ICD-10-CM

## 2019-06-21 DIAGNOSIS — I5022 Chronic systolic (congestive) heart failure: Secondary | ICD-10-CM | POA: Diagnosis not present

## 2019-06-21 NOTE — Patient Instructions (Signed)
Medication Instructions:  Your physician recommends that you continue on your current medications as directed. Please refer to the Current Medication list given to you today.  *If you need a refill on your cardiac medications before your next appointment, please call your pharmacy*  Lab Work: None If you have labs (blood work) drawn today and your tests are completely normal, you will receive your results only by: Marland Kitchen MyChart Message (if you have MyChart) OR . A paper copy in the mail If you have any lab test that is abnormal or we need to change your treatment, we will call you to review the results.  Testing/Procedures: Your physician has requested that you have an echocardiogram 1-2 weeks prior to seeing Dr. Tamala Julian back in 6 months. Echocardiography is a painless test that uses sound waves to create images of your heart. It provides your doctor with information about the size and shape of your heart and how well your heart's chambers and valves are working. This procedure takes approximately one hour. There are no restrictions for this procedure.    Follow-Up: At [facility], you and your health needs are our priority.  As part of our continuing mission to provide you with exceptional heart care, we have created designated Provider Care Teams.  These Care Teams include your primary Cardiologist (physician) and Advanced Practice Providers (APPs -  Physician Assistants and Nurse Practitioners) who all work together to provide you with the care you need, when you need it.  Your next appointment:   6 month(s)  The format for your next appointment:   In Person  Provider:   You may see Sinclair Grooms, MD or one of the following Advanced Practice Providers on your designated Care Team:    Truitt Merle, NP  Cecilie Kicks, NP  Kathyrn Drown, NP   Other Instructions

## 2019-06-22 ENCOUNTER — Other Ambulatory Visit: Payer: Self-pay | Admitting: Interventional Cardiology

## 2019-07-04 ENCOUNTER — Encounter (INDEPENDENT_AMBULATORY_CARE_PROVIDER_SITE_OTHER): Payer: Self-pay

## 2019-07-04 ENCOUNTER — Other Ambulatory Visit: Payer: Self-pay

## 2019-07-04 ENCOUNTER — Ambulatory Visit (HOSPITAL_COMMUNITY): Payer: Medicare Other | Attending: Cardiology

## 2019-07-04 DIAGNOSIS — I5022 Chronic systolic (congestive) heart failure: Secondary | ICD-10-CM | POA: Insufficient documentation

## 2019-07-05 ENCOUNTER — Other Ambulatory Visit: Payer: Self-pay | Admitting: *Deleted

## 2019-07-05 MED ORDER — ENTRESTO 49-51 MG PO TABS: ORAL_TABLET | ORAL | 5 refills | Status: DC

## 2019-07-08 ENCOUNTER — Other Ambulatory Visit: Payer: Self-pay | Admitting: Interventional Cardiology

## 2019-09-27 IMAGING — CR DG CHEST 2V
2 series · 2 of 2 positions shown · non-contrast
Comparison: None.

CLINICAL DATA: Cough

EXAM:
CHEST - 2 VIEW

[w chest pa]
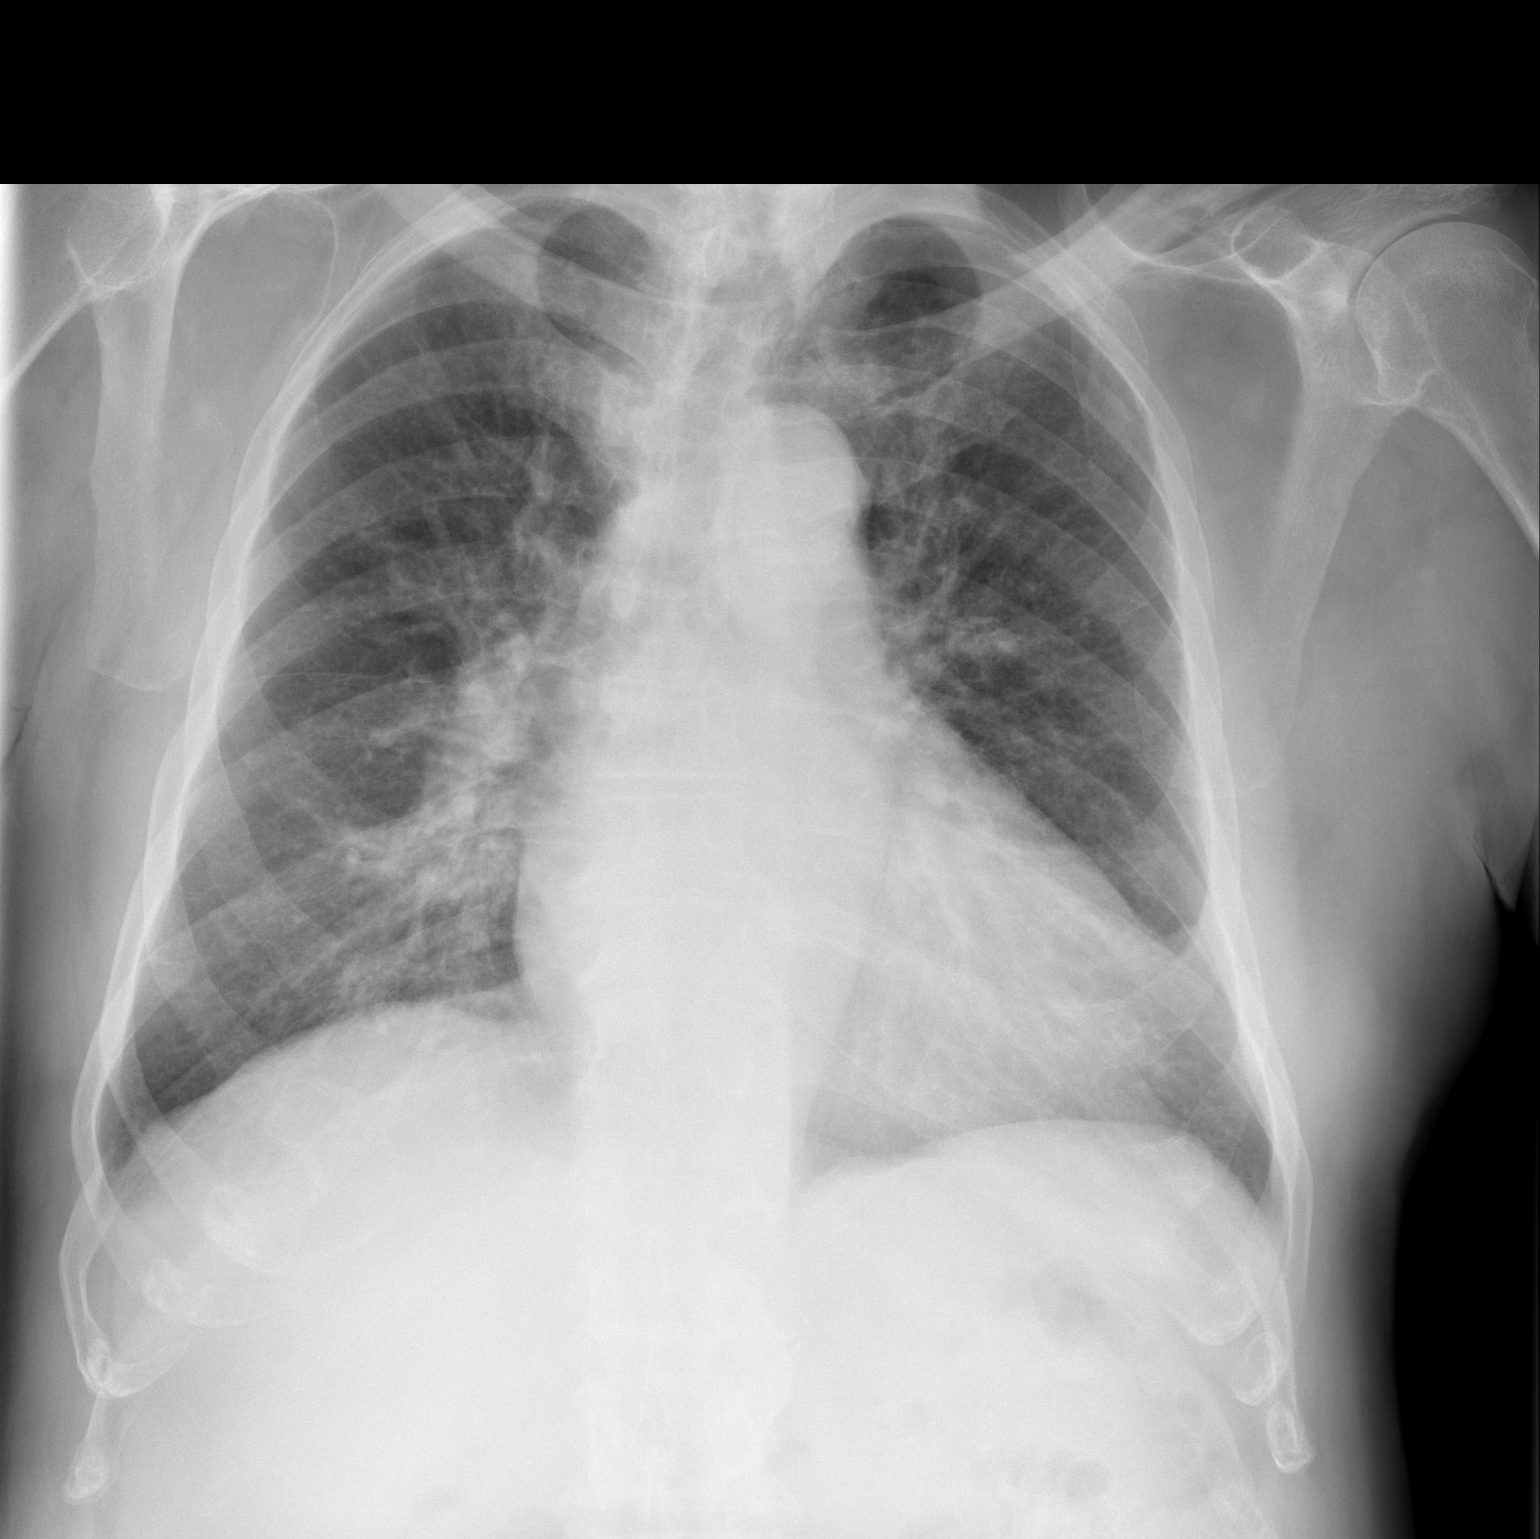

[w chest lat]
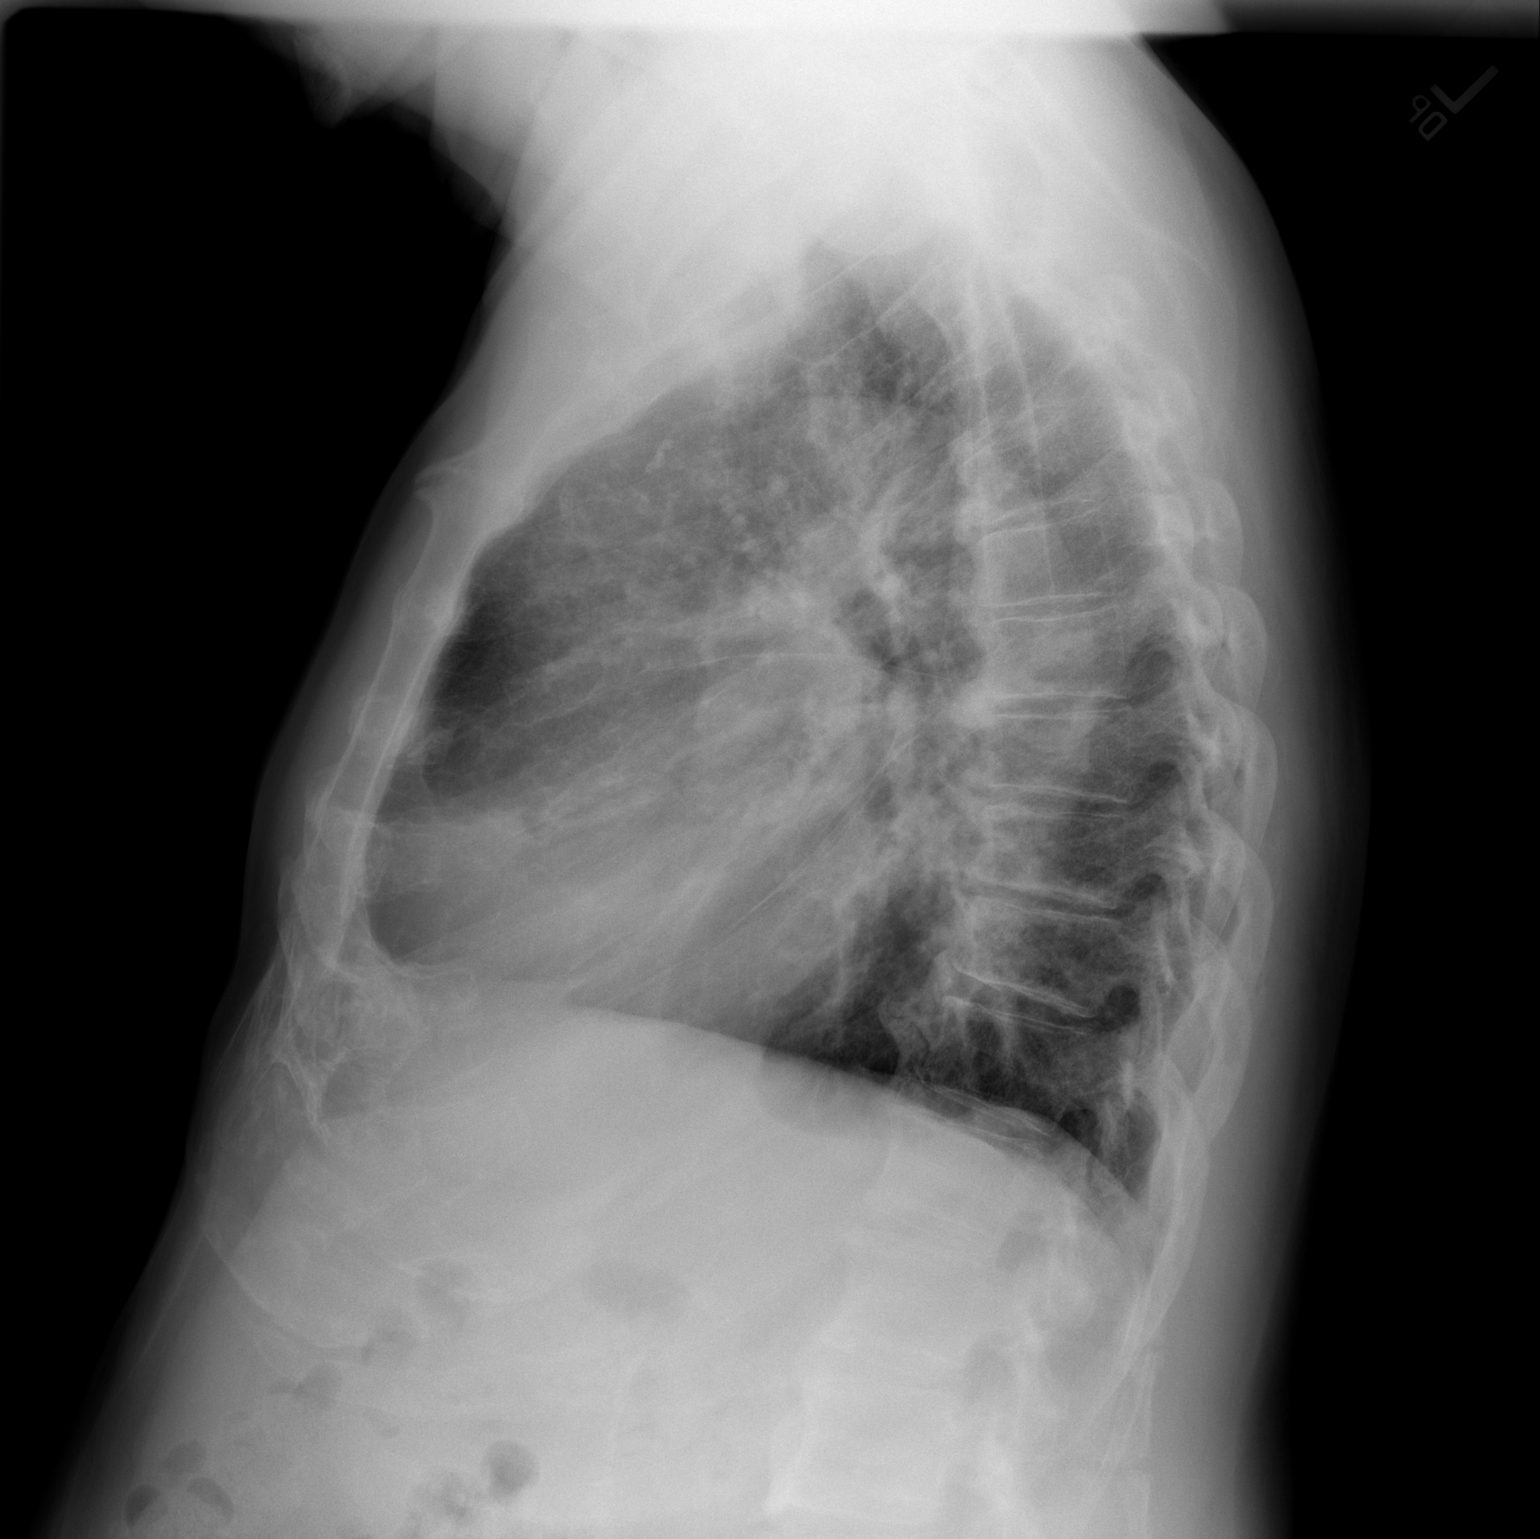

[2 of 2 positions shown; findings below may reference images not displayed]

FINDINGS: Cardiomegaly. Negative aortic and hilar contours. Perihilar
interstitial coarsening. No Kerley lines or effusion. No air
bronchogram.

Degenerative endplate spurring.
IMPRESSION: 1. Bronchitic markings without focal pneumonia or collapse.
2. Cardiomegaly.

## 2019-11-01 ENCOUNTER — Other Ambulatory Visit: Payer: Self-pay | Admitting: Interventional Cardiology

## 2019-12-21 ENCOUNTER — Other Ambulatory Visit (HOSPITAL_COMMUNITY): Payer: Self-pay | Admitting: Student

## 2019-12-23 IMAGING — DX DG CHEST 2V
2 series · 2 of 2 positions shown · non-contrast
Comparison: 08/30/2017.

CLINICAL DATA: Shortness of breath.

EXAM:
CHEST - 2 VIEW

[chest pa]
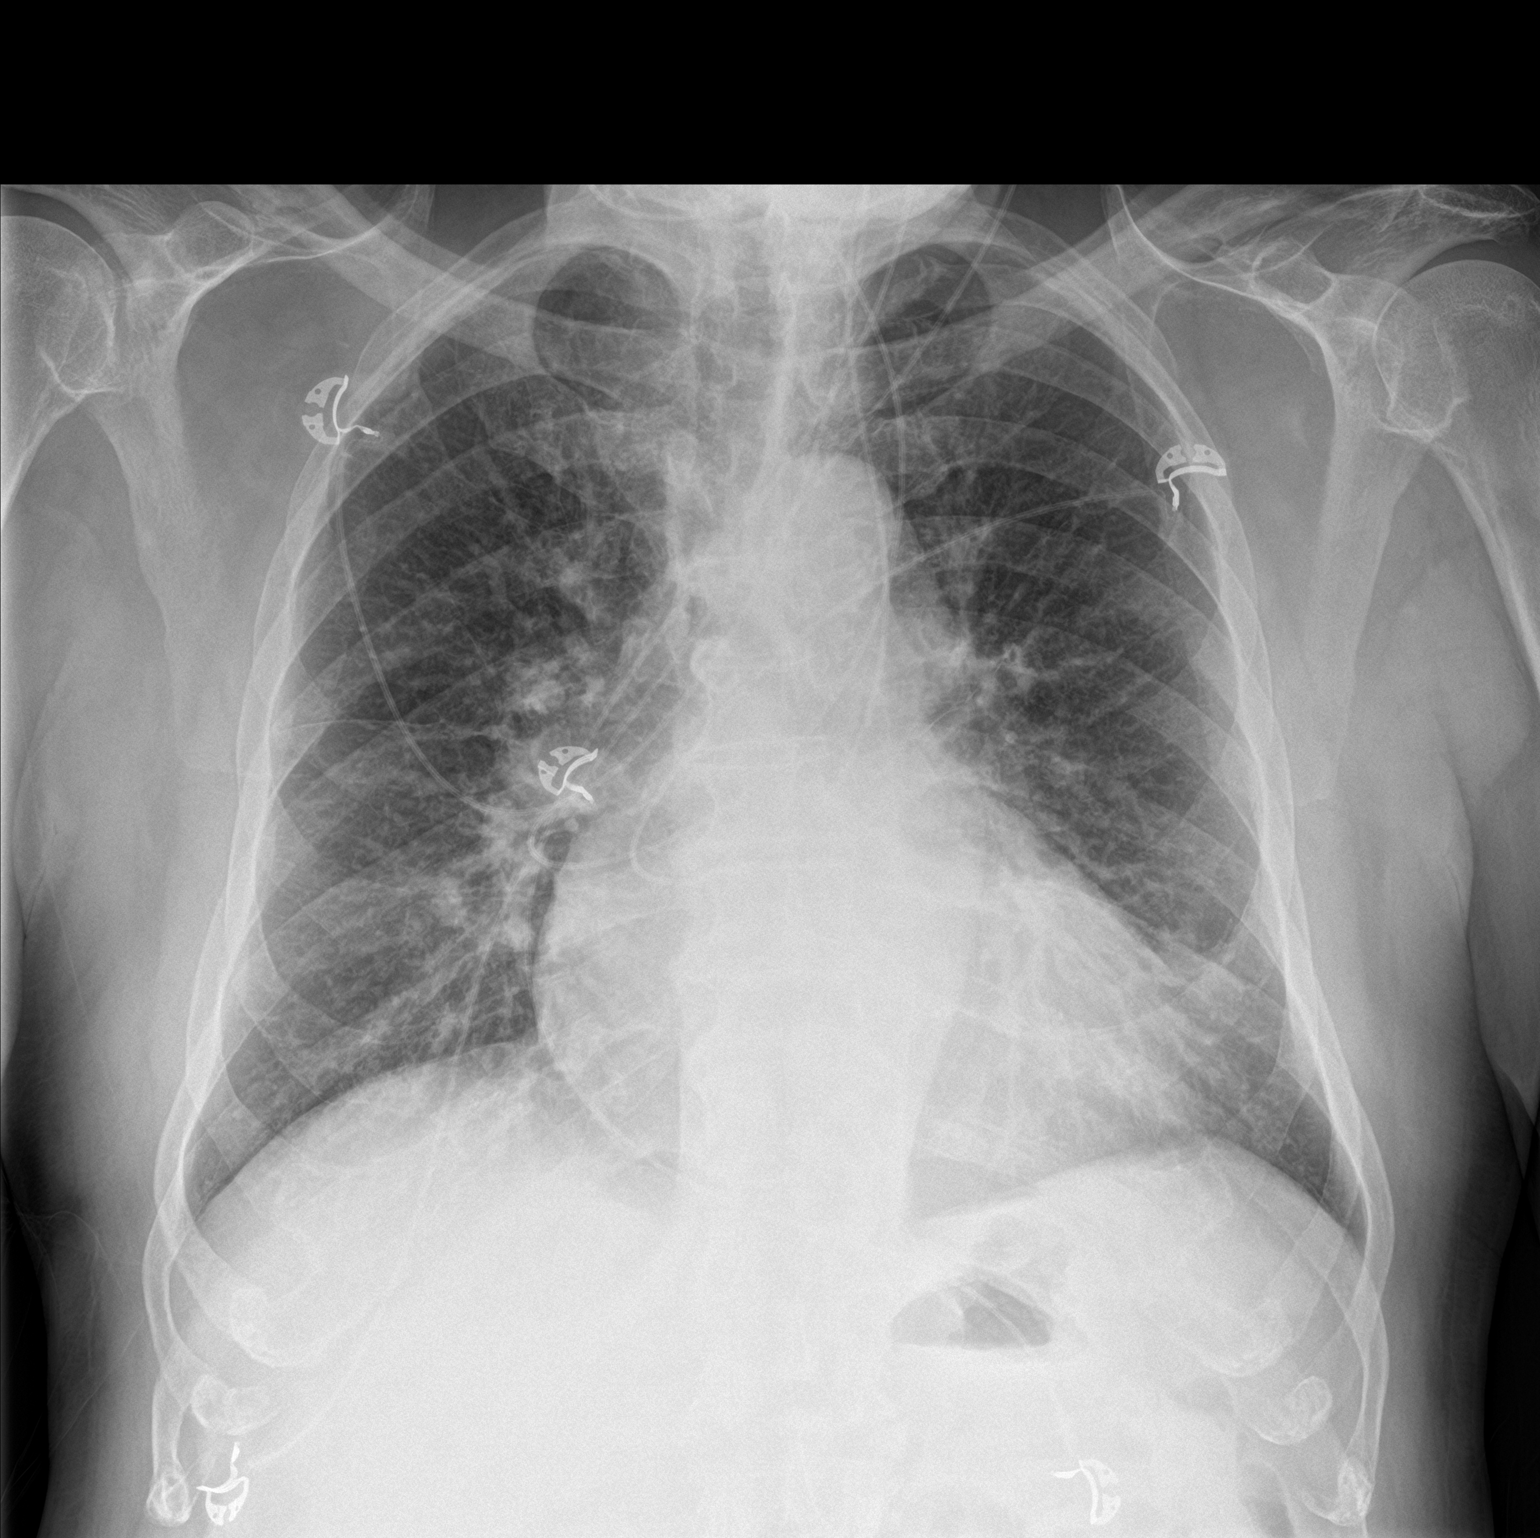

[chest lat]
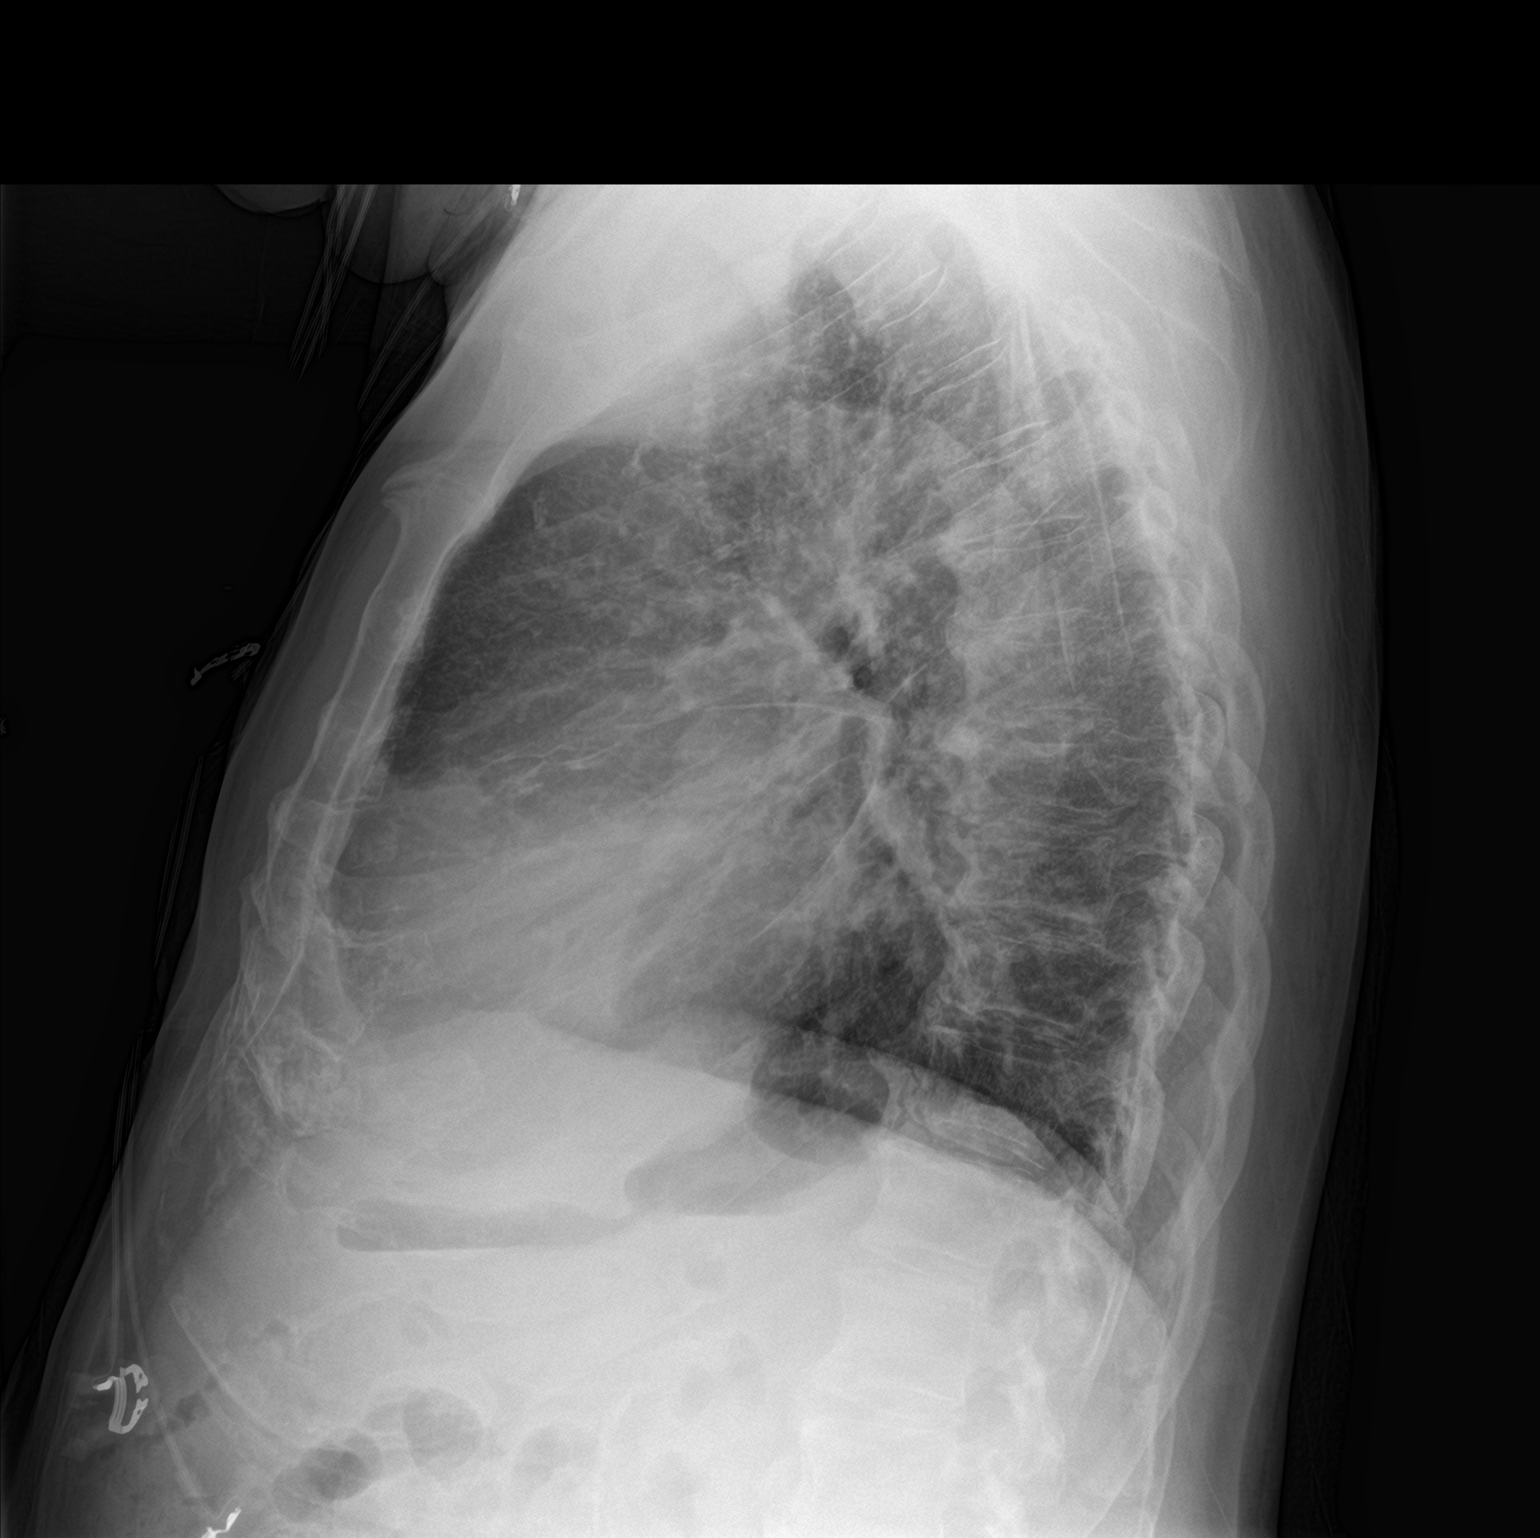

[2 of 2 positions shown; findings below may reference images not displayed]

FINDINGS: Cardiomegaly with mild pulmonary venous congestion. Mild pulmonary
interstitial prominence. No pleural effusion or pneumothorax.
Degenerative change thoracic spine.
IMPRESSION: Cardiomegaly with mild pulmonary venous congestion. Mild
interstitial prominence. These changes may be related to CHF

## 2020-01-31 ENCOUNTER — Other Ambulatory Visit: Payer: Self-pay | Admitting: Interventional Cardiology

## 2020-02-10 ENCOUNTER — Other Ambulatory Visit: Payer: Self-pay | Admitting: Interventional Cardiology

## 2020-03-13 ENCOUNTER — Other Ambulatory Visit (HOSPITAL_COMMUNITY): Payer: Self-pay | Admitting: Student

## 2020-04-24 ENCOUNTER — Other Ambulatory Visit: Payer: Self-pay | Admitting: Interventional Cardiology

## 2020-05-01 ENCOUNTER — Telehealth: Payer: Self-pay | Admitting: Interventional Cardiology

## 2020-05-01 NOTE — Telephone Encounter (Signed)
Will send to our prior auth nurse Larita Fife

## 2020-05-01 NOTE — Telephone Encounter (Signed)
    Pt c/o medication issue:  1. Name of Medication:   sacubitril-valsartan (ENTRESTO) 49-51 MG    2. How are you currently taking this medication (dosage and times per day)? TAKE 1 TABLET BY MOUTH 2 (TWO) TIMES DAILY.  3. Are you having a reaction (difficulty breathing--STAT)?   4. What is your medication issue?  Pt's daughter in law calling, she said pt have new insurance and they need medication list and prior auth for his entresto. She said to call her husband Linton Flemings to get pt's new insurance information.

## 2020-05-02 ENCOUNTER — Emergency Department (HOSPITAL_COMMUNITY): Payer: Medicare HMO

## 2020-05-02 ENCOUNTER — Encounter (HOSPITAL_COMMUNITY): Payer: Self-pay | Admitting: Emergency Medicine

## 2020-05-02 ENCOUNTER — Emergency Department (HOSPITAL_COMMUNITY)
Admission: EM | Admit: 2020-05-02 | Discharge: 2020-05-02 | Disposition: A | Discharge status: Home / Self Care | Payer: Medicare HMO | Attending: Emergency Medicine | Admitting: Emergency Medicine

## 2020-05-02 ENCOUNTER — Other Ambulatory Visit: Payer: Self-pay

## 2020-05-02 DIAGNOSIS — R066 Hiccough: Secondary | ICD-10-CM | POA: Diagnosis not present

## 2020-05-02 DIAGNOSIS — N182 Chronic kidney disease, stage 2 (mild): Secondary | ICD-10-CM | POA: Insufficient documentation

## 2020-05-02 DIAGNOSIS — E1122 Type 2 diabetes mellitus with diabetic chronic kidney disease: Secondary | ICD-10-CM | POA: Diagnosis not present

## 2020-05-02 DIAGNOSIS — I5023 Acute on chronic systolic (congestive) heart failure: Secondary | ICD-10-CM | POA: Insufficient documentation

## 2020-05-02 DIAGNOSIS — I509 Heart failure, unspecified: Secondary | ICD-10-CM

## 2020-05-02 DIAGNOSIS — I110 Hypertensive heart disease with heart failure: Secondary | ICD-10-CM | POA: Diagnosis not present

## 2020-05-02 DIAGNOSIS — F17290 Nicotine dependence, other tobacco product, uncomplicated: Secondary | ICD-10-CM | POA: Diagnosis not present

## 2020-05-02 DIAGNOSIS — R0602 Shortness of breath: Secondary | ICD-10-CM | POA: Diagnosis not present

## 2020-05-02 DIAGNOSIS — J449 Chronic obstructive pulmonary disease, unspecified: Secondary | ICD-10-CM | POA: Diagnosis not present

## 2020-05-02 DIAGNOSIS — I130 Hypertensive heart and chronic kidney disease with heart failure and stage 1 through stage 4 chronic kidney disease, or unspecified chronic kidney disease: Secondary | ICD-10-CM | POA: Insufficient documentation

## 2020-05-02 DIAGNOSIS — J811 Chronic pulmonary edema: Secondary | ICD-10-CM | POA: Diagnosis not present

## 2020-05-02 DIAGNOSIS — I517 Cardiomegaly: Secondary | ICD-10-CM | POA: Diagnosis not present

## 2020-05-02 DIAGNOSIS — R69 Illness, unspecified: Secondary | ICD-10-CM | POA: Diagnosis not present

## 2020-05-02 LAB — CBC
HCT: 51.9 % (ref 39.0–52.0)
Hemoglobin: 16.4 g/dL (ref 13.0–17.0)
MCH: 26.8 pg (ref 26.0–34.0)
MCHC: 31.6 g/dL (ref 30.0–36.0)
MCV: 84.8 fL (ref 80.0–100.0)
Platelets: 255 10*3/uL (ref 150–400)
RBC: 6.12 MIL/uL — ABNORMAL HIGH (ref 4.22–5.81)
RDW: 16.5 % — ABNORMAL HIGH (ref 11.5–15.5)
WBC: 8.3 10*3/uL (ref 4.0–10.5)
nRBC: 0 % (ref 0.0–0.2)

## 2020-05-02 LAB — BASIC METABOLIC PANEL
Anion gap: 10 (ref 5–15)
BUN: 15 mg/dL (ref 8–23)
CO2: 28 mmol/L (ref 22–32)
Calcium: 8.9 mg/dL (ref 8.9–10.3)
Chloride: 97 mmol/L — ABNORMAL LOW (ref 98–111)
Creatinine, Ser: 1.55 mg/dL — ABNORMAL HIGH (ref 0.61–1.24)
GFR, Estimated: 44 mL/min — ABNORMAL LOW (ref >60)
Glucose, Bld: 120 mg/dL — ABNORMAL HIGH (ref 70–99)
Potassium: 4.3 mmol/L (ref 3.5–5.1)
Sodium: 135 mmol/L (ref 135–145)

## 2020-05-02 LAB — TROPONIN I (HIGH SENSITIVITY)
Troponin I (High Sensitivity): 31 ng/L — ABNORMAL HIGH (ref <18)
Troponin I (High Sensitivity): 41 ng/L — ABNORMAL HIGH (ref <18)
Troponin I (High Sensitivity): 21 ng/L — ABNORMAL HIGH (ref <18)

## 2020-05-02 LAB — BRAIN NATRIURETIC PEPTIDE: B Natriuretic Peptide: 329 pg/mL — ABNORMAL HIGH (ref 0.0–100.0)

## 2020-05-02 MED ORDER — FUROSEMIDE 10 MG/ML IJ SOLN
40.0000 mg | Freq: Once | INTRAMUSCULAR | Status: AC
  Administered 2020-05-02: 40 mg via INTRAVENOUS
  Filled 2020-05-02: qty 4

## 2020-05-02 NOTE — ED Triage Notes (Signed)
Pt reports getting hiccups last night which caused him to be sob, has also had hiccups once today. Not sob except while hiccupping, no chest pain.

## 2020-05-02 NOTE — ED Provider Notes (Signed)
Emergency Department Provider Note   I have reviewed the triage vital signs and the nursing notes.   HISTORY  Chief Complaint Shortness of Breath and Hiccups   HPI Gary Morse is a 86 y.o. male with PMH reviewed below presents to the ED with hiccups and resulting CP. Symptoms started yesterday. Patient has been feeling well overall but develops intermittent hiccups which give him chest pains and and SOB but only while having hiccups. No fever or chills. He has been compliant with meds at home except for this AM. Has not noted increased ankle swelling. No abdominal pain, nausea, vomiting, or diarrhea.    Past Medical History:  Diagnosis Date  . Adenomatous polyp    POSITIVE, COLON 7/18, COLOGAURD COLON NEGATIVE  . Allergic rhinitis   . BPH (benign prostatic hyperplasia)    MICROSCOPIC HEMATURIA WORKUP IN 2004 WITH UROLOGY NEGATIVE, DR. Karsten Ro  . CHF (congestive heart failure) ([place])    EPISODE IN 1994 SECONDARY TO HYPERTENSION, ECHO 2005 NORMAL LV FUNCTION  . CKD (chronic kidney disease)    STAGE 2 MICROALBUMIN POSITIVE  . Colon polyp    COLON IN 2018  . COPD (chronic obstructive pulmonary disease) ([place])    ON X-RAY, CIGAR USE--PFT IN 2013 NORMAL  . Diabetes type 2, controlled ([place])   . Dyslipidemia   . Dyspnea   . ED (erectile dysfunction)   . Hypertension   . Patellar bursitis of right knee    PRE-PATELLA BURSITIS WITH INTERMITTENT SWELLING     Patient Active Problem List   Diagnosis Date Noted  . Hypertensive heart disease with heart failure ([place]) 11/26/2017  . Chronic systolic heart failure ([place]) 11/24/2017  . Acute on chronic systolic heart failure ([place]) 11/24/2017  . RBBB 11/02/2017  . Frequent PVCs 11/02/2017  . SOB (shortness of breath) 11/02/2017    Past Surgical History:  Procedure Laterality Date  . COLONOSCOPY  12/2010   10/2016  . INGUINAL HERNIA REPAIR Right    1976    Allergies Metformin and related and Penicillins  Family History   Problem Relation Age of Onset  . Congestive Heart Failure Mother 69  . Heart attack Father 52  . CAD Father   . Heart failure Sister   . Congestive Heart Failure Sister   . Throat cancer Brother   . Kidney failure Brother   . Other Brother        END STAGE RENAL DISEASE  . Colon cancer Neg Hx   . Colon polyps Neg Hx   . Liver disease Neg Hx     Social History Social History   Tobacco Use  . Smoking status: Current Every Day Smoker    Types: Cigars  . Smokeless tobacco: Never Used  Vaping Use  . Vaping Use: Never used  Substance Use Topics  . Alcohol use: Yes    Comment: OCCASIONAL  . Drug use: Never    Review of Systems  Constitutional: No fever/chills Eyes: No visual changes. ENT: No sore throat. Cardiovascular: Positive chest pain w/ hiccups.  Respiratory: Positive shortness of breath w/ hiccups.  Gastrointestinal: No abdominal pain.  No nausea, no vomiting.  No diarrhea.  No constipation. Genitourinary: Negative for dysuria. Musculoskeletal: Negative for back pain. Skin: Negative for rash. Neurological: Negative for headaches, focal weakness or numbness.  10-point ROS otherwise negative.  ____________________________________________   PHYSICAL EXAM:  VITAL SIGNS: ED Triage Vitals  Enc Vitals Group     BP 05/02/20 0905 (!) 199/109  Pulse Rate 05/02/20 0905 88     Resp 05/02/20 0905 18     Temp 05/02/20 0905 98.9 F (37.2 C)     Temp Source 05/02/20 0905 Oral     SpO2 05/02/20 0905 100 %     Weight 05/02/20 0905 198 lb (89.8 kg)     Height 05/02/20 0905 5\' 8"  (1.727 m)   Constitutional: Alert and oriented. Well appearing and in no acute distress. Eyes: Conjunctivae are normal.  Head: Atraumatic. Nose: No congestion/rhinnorhea. Mouth/Throat: Mucous membranes are moist.  Neck: No stridor.  Cardiovascular: Normal rate, regular rhythm. Good peripheral circulation. Grossly normal heart sounds.   Respiratory: Normal respiratory effort.  No  retractions. Lungs CTAB. Gastrointestinal: Soft and nontender. No distention.  Musculoskeletal: No lower extremity tenderness nor edema. No gross deformities of extremities. Neurologic:  Normal speech and language. No gross focal neurologic deficits are appreciated.  Skin:  Skin is warm, dry and intact. No rash noted.   ____________________________________________   LABS (all labs ordered are listed, but only abnormal results are displayed)  Labs Reviewed  BASIC METABOLIC PANEL - Abnormal; Notable for the following components:      Result Value   Chloride 97 (*)    Glucose, Bld 120 (*)    Creatinine, Ser 1.55 (*)    GFR, Estimated 44 (*)    All other components within normal limits  CBC - Abnormal; Notable for the following components:   RBC 6.12 (*)    RDW 16.5 (*)    All other components within normal limits  BRAIN NATRIURETIC PEPTIDE - Abnormal; Notable for the following components:   B Natriuretic Peptide 329.0 (*)    All other components within normal limits  TROPONIN I (HIGH SENSITIVITY) - Abnormal; Notable for the following components:   Troponin I (High Sensitivity) 31 (*)    All other components within normal limits  TROPONIN I (HIGH SENSITIVITY) - Abnormal; Notable for the following components:   Troponin I (High Sensitivity) 41 (*)    All other components within normal limits  TROPONIN I (HIGH SENSITIVITY) - Abnormal; Notable for the following components:   Troponin I (High Sensitivity) 21 (*)    All other components within normal limits  TROPONIN I (HIGH SENSITIVITY)   ____________________________________________  EKG   EKG Interpretation  Date/Time:  Thursday May 02 2020 09:06:25 EST Ventricular Rate:  80 PR Interval:  254 QRS Duration: 156 QT Interval:  450 QTC Calculation: 519 R Axis:   166 Text Interpretation: Sinus rhythm with 1st degree A-V block with occasional Premature ventricular complexes Right bundle branch block Septal infarct , age  undetermined Abnormal ECG No STEMI Confirmed by Nanda Quinton [id number]) on 05/02/2020 1:10:27 PM       ____________________________________________  RADIOLOGY  DG Chest 2 View  Result Date: 05/02/2020 CLINICAL DATA:  Hiccups, shortness of breath. EXAM: CHEST - 2 VIEW COMPARISON:  November 25, 2017. FINDINGS: Similar enlargement the cardiac silhouette with central pulmonary vascular congestion. No overt pulmonary edema. No consolidation. No visible pleural effusions or pneumothorax. No acute osseous abnormality. Degenerative changes of the thoracic spine. IMPRESSION: Similar cardiomegaly and central pulmonary vascular congestion without overt pulmonary edema. Electronically Signed   By: Margaretha Sheffield MD   On: 05/02/2020 09:30    ____________________________________________   PROCEDURES  Procedure(s) performed:   Procedures  None  ____________________________________________   INITIAL IMPRESSION / ASSESSMENT AND PLAN / ED COURSE  Pertinent labs & imaging results that were available during my care of the  patient were reviewed by me and considered in my medical decision making (see chart for details).   Patient presents to the ED with hiccups. No active hiccups at the time of my exam. CXR reviewed with some vascular congestion. No masses. Non-tender abdomen. Plan for IV lasix here with missing his AM dose. Will send troponin and BNP. Doubt ACS/PE and symptoms are only with hiccups. Will hold on Thorazine or Ativan given patient's age and risk of increased side effects from these meds.   Labs and imaging reviewed. Troponin at 31 with no recent labs for comparison. Will obtain additional labs and likely d/c if stable. Care transferred to Dr. Fredderick Phenix.  ____________________________________________  FINAL CLINICAL IMPRESSION(S) / ED DIAGNOSES  Final diagnoses:  Hiccoughs  Acute on chronic congestive heart failure, unspecified heart failure type (HCC)     MEDICATIONS GIVEN DURING THIS  VISIT:  Medications  furosemide (LASIX) injection 40 mg (40 mg Intravenous Given 05/02/20 1240)    Note:  This document was prepared using Dragon voice recognition software and may include unintentional dictation errors.  Alona Bene, MD, [facility] Emergency Medicine    Welford Christmas, Arlyss Repress, MD 05/03/20 [phone]

## 2020-05-02 NOTE — ED Notes (Signed)
Got patient int a gown on the monitor patient is resting with call bell in reach

## 2020-05-02 NOTE — ED Notes (Signed)
Patient presents to the ED d/t hiccups. Patient denies SOB other than when he has the hiccups.   Airway clear and patent. 100% on RA  Denies any SOB, HTN noted upon arrival.   VSS

## 2020-05-02 NOTE — Telephone Encounter (Signed)
**Note De-Identified Rogan Wigley Obfuscation** I called [facility] and s/w Rosanne Ashing to get new ins info needed to do this [place] PA. Per Rosanne Ashing they just received the pts new ins card this morning and he re-ran his Entresto script while we were on the phone and advised me that it is cover without a PA required and will cost the pt $9.85/30 day supply.  Rosanne Ashing states that they will notify the pt that he can pick his Sherryll Burger today.

## 2020-05-02 NOTE — ED Notes (Signed)
Patient discharged, vetrbalized understanding of DC instructions.  WC to WR with son   0/ 10 pain  PIV removed

## 2020-05-02 NOTE — ED Provider Notes (Signed)
Care was taken over from Dr. Jacqulyn Bath pending repeat troponin.  Patient presented with hiccups.  He had some chest pain but only associated with the hiccups.  He felt some discomfort when the hiccups were severe.  He had some evidence of some mild fluid overload.  He was given some Lasix.  He seemed to have improvement.  His initial troponin was mildly elevated.  The second 1 was slightly increased so I checked a third 1 which was down almost to normal levels.  He does not have symptoms that sound more concerning for ACS.  He was discharged home in good condition.  He was encouraged to follow-up with his PCP.  Return precautions were given.   Rolan Bucco, MD 05/02/20 1946

## 2020-05-02 NOTE — Discharge Instructions (Signed)
Follow-up with your primary care doctor for recheck.  Your kidney function was slightly worse than it has been and should be rechecked by your primary care doctor.  Return here as needed if you have any worsening symptoms including chest pain, shortness of breath or other worsening symptoms.

## 2020-05-08 DIAGNOSIS — I5020 Unspecified systolic (congestive) heart failure: Secondary | ICD-10-CM | POA: Diagnosis not present

## 2020-05-08 DIAGNOSIS — J449 Chronic obstructive pulmonary disease, unspecified: Secondary | ICD-10-CM | POA: Diagnosis not present

## 2020-05-08 DIAGNOSIS — R066 Hiccough: Secondary | ICD-10-CM | POA: Diagnosis not present

## 2020-05-09 NOTE — Progress Notes (Signed)
Cardiology Office Note:    Date:  05/10/2020   ID:  Gary Morse, DOB 1935/04/16, MRN [id number]  PCP:  [name], MD  Cardiologist:  Sinclair Grooms, MD   Referring MD: [name], MD   Chief Complaint  Patient presents with  . Congestive Heart Failure    History of Present Illness:    Gary Morse is a 86 y.o. male with a hx of  chronic systolic and diastolic heart failure felt secondary to hypertension, frequent PVCs, and EF25-30%7/ 2019 -> 40% 02/2018.  He was seen in the emergency room because he was having chronic hiccups and shortness of breath.  He was evaluated.  Chest x-ray was done.  A dose of IV Lasix was given.  This did not help the hiccups.  The patient was discharged because he was not felt to be acutely ill.  He saw Dr. Deforest Hoyles who started Symbicort.  Symbicort seem to significantly reduce the amount of hiccups to the point that it resolved and now he is doing much better.  He denies orthopnea, PND, syncope, lower extremity swelling, but is sad because of the loss of his wife of 60 years.  She died in 2023/04/01 of metastatic cancer.  Past Medical History:  Diagnosis Date  . Adenomatous polyp    POSITIVE, COLON 7/18, COLOGAURD COLON NEGATIVE  . Allergic rhinitis   . BPH (benign prostatic hyperplasia)    MICROSCOPIC HEMATURIA WORKUP IN 2004 WITH UROLOGY NEGATIVE, DR. Karsten Ro  . CHF (congestive heart failure) ([place])    EPISODE IN 1994 SECONDARY TO HYPERTENSION, ECHO 2005 NORMAL LV FUNCTION  . CKD (chronic kidney disease)    STAGE 2 MICROALBUMIN POSITIVE  . Colon polyp    COLON IN 2018  . COPD (chronic obstructive pulmonary disease) ([place])    ON X-RAY, CIGAR USE--PFT IN 2013 NORMAL  . Diabetes type 2, controlled ([place])   . Dyslipidemia   . Dyspnea   . ED (erectile dysfunction)   . Hypertension   . Patellar bursitis of right knee    PRE-PATELLA BURSITIS WITH INTERMITTENT SWELLING     Past Surgical History:  Procedure Laterality Date  .  COLONOSCOPY  12/2010   10/2016  . INGUINAL HERNIA REPAIR Right    1976    Current Medications: Current Meds  Medication Sig  . amiodarone (PACERONE) 200 MG tablet Take 1 tablet (200 mg total) by mouth daily. Please schedule appt for future refills. Thank you!  Marland Kitchen aspirin EC 81 MG tablet Take 81 mg by mouth daily.  . budesonide-formoterol (SYMBICORT) 80-4.5 MCG/ACT inhaler SMARTSIG:By Mouth  . carvedilol (COREG) 6.25 MG tablet TAKE 1 TABLET (6.25 MG TOTAL) BY MOUTH 2 (TWO) TIMES DAILY WITH A MEAL.  Marland Kitchen FARXIGA 10 MG TABS tablet Take 10 mg by mouth daily.  . fluticasone (FLONASE) 50 MCG/ACT nasal spray Place 1 spray into both nostrils daily as needed for allergies.   . furosemide (LASIX) 40 MG tablet TAKE 1 TABLET BY MOUTH TWICE DAILY. TAKE AN ADDITIONAL TABLET IF YOU GAIN 2 LBS OR MORE.  Marland Kitchen glimepiride (AMARYL) 1 MG tablet Take 1 mg by mouth daily with breakfast.  . KLOR-CON M20 20 MEQ tablet TAKE 1 TABLET BY MOUTH EVERY DAY .APPT REQUIRED FOR MORE REFILLS  . levothyroxine (SYNTHROID) 50 MCG tablet Take 50 mcg by mouth every morning.  . sacubitril-valsartan (ENTRESTO) 49-51 MG TAKE 1 TABLET BY MOUTH 2 (TWO) TIMES DAILY.  . simvastatin (ZOCOR) 10 MG tablet Take 10 mg by mouth at bedtime.  Allergies:   Metformin and related and Penicillins   Social History   Socioeconomic History  . Marital status: Married    Spouse name: Not on file  . Number of children: 7  . Years of education: Not on file  . Highest education level: Not on file  Occupational History  . Occupation: CARPENTER  Tobacco Use  . Smoking status: Current Every Day Smoker    Types: Cigars  . Smokeless tobacco: Never Used  Vaping Use  . Vaping Use: Never used  Substance and Sexual Activity  . Alcohol use: Yes    Comment: OCCASIONAL  . Drug use: Never  . Sexual activity: Not on file  Other Topics Concern  . Not on file  Social History Narrative  . Not on file   Social Determinants of Health   Financial  Resource Strain: Not on file  Food Insecurity: Not on file  Transportation Needs: Not on file  Physical Activity: Not on file  Stress: Not on file  Social Connections: Not on file     Family History: The patient's family history includes CAD in his father; Congestive Heart Failure in his sister; Congestive Heart Failure (age of onset: 2) in his mother; Heart attack (age of onset: 46) in his father; Heart failure in his sister; Kidney failure in his brother; Other in his brother; Throat cancer in his brother. There is no history of Colon cancer, Colon polyps, or Liver disease.  ROS:   Please see the history of present illness.    Saddened due to the death of his wife.  Hiccups have resolved after starting Symbicort.  He has had no prolonged chest pain that would suggest myocardial infarction.  All other systems reviewed and are negative.  EKGs/Labs/Other Studies Reviewed:    The following studies were reviewed today: Chest x-ray suggested fluid buildup.  Performed on May 02, 2020 IMPRESSION: Similar cardiomegaly and central pulmonary vascular congestion without overt pulmonary edema.   EKG:  EKG EKG performed 05/03/2020 demonstrated bundle, first-degree AV block, left axis deviation.  No acute ST-T wave change.  No change when compared to prior.  Recent Labs: 05/02/2020: B Natriuretic Peptide 329.0; BUN 15; Creatinine, Ser 1.55; Hemoglobin 16.4; Platelets 255; Potassium 4.3; Sodium 135  Recent Lipid Panel No results found for: CHOL, TRIG, HDL, CHOLHDL, VLDL, LDLCALC, LDLDIRECT  Physical Exam:    VS:  BP 118/72   Pulse (!) 54   Ht 5\' 8"  (1.727 m)   Wt 180 lb (81.6 kg)   BMI 27.37 kg/m     Wt Readings from Last 3 Encounters:  05/10/20 180 lb (81.6 kg)  05/02/20 198 lb (89.8 kg)  06/21/19 203 lb 6.4 oz (92.3 kg)     GEN: Palpable with age of 55.. No acute distress HEENT: Normal NECK: No JVD. LYMPHATICS: No lymphadenopathy CARDIAC: Soft apical systolic murmur.  Irregular  rate and rhythm.  Not in A. fib though.  A summation gallop is audible.,  No edema. VASCULAR:  Normal Pulses. No bruits. RESPIRATORY:  Clear to auscultation without rales, wheezing or rhonchi  ABDOMEN: Soft, non-tender, non-distended, No pulsatile mass, MUSCULOSKELETAL: No deformity  SKIN: Warm and dry NEUROLOGIC:  Alert and oriented x 3 PSYCHIATRIC:  Normal affect   ASSESSMENT:    1. Chronic systolic heart failure ([place])   2. Hypertensive heart disease with heart failure ([place])   3. Controlled type 2 diabetes mellitus with other oral complication, without long-term current use of insulin ([place])   4. PVC (premature  ventricular contraction)   5. On amiodarone therapy   6. Educated about COVID-19 virus infection    PLAN:    In order of problems listed above:  1. Confirmed that the patient is currently on Farxiga 10 mg/day, Entresto 49/51 mg p.o. twice daily, furosemide 40 mg twice daily, carvedilol 6.25 mg twice daily. 2. Blood pressures under adequate control on his heart failure regimen. 3. We did not discuss diabetes.  But Amaryl and Wilder Glade are being used.  Most recent hemoglobin A1c in November was 7.1. 4. PVCs are being suppressed by amiodarone 200 mg/day.  As above showed no evidence of toxicity liver tests were not done recently done in 2020 was mildly elevated.  We will get a liver panel and TSH in 3 months. 5. Continue amiodarone for suppression of PVCs.  Blood work will be done.  Recent chest x-ray did not reveal evidence of amiodarone toxicity. 6. He is vaccinated and boosted.  Practicing social mitigation.   Medication Adjustments/Labs and Tests Ordered: Current medicines are reviewed at length with the patient today.  Concerns regarding medicines are outlined above.  Orders Placed This Encounter  Procedures  . Basic metabolic panel   No orders of the defined types were placed in this encounter.   Patient Instructions  Medication Instructions:  Your physician  recommends that you continue on your current medications as directed. Please refer to the Current Medication list given to you today.  *If you need a refill on your cardiac medications before your next appointment, please call your pharmacy*   Lab Work: BMET in 3 months  If you have labs (blood work) drawn today and your tests are completely normal, you will receive your results only by: Marland Kitchen MyChart Message (if you have MyChart) OR . A paper copy in the mail If you have any lab test that is abnormal or we need to change your treatment, we will call you to review the results.   Testing/Procedures: None   Follow-Up: At [facility], you and your health needs are our priority.  As part of our continuing mission to provide you with exceptional heart care, we have created designated Provider Care Teams.  These Care Teams include your primary Cardiologist (physician) and Advanced Practice Providers (APPs -  Physician Assistants and Nurse Practitioners) who all work together to provide you with the care you need, when you need it.  We recommend signing up for the patient portal called "MyChart".  Sign up information is provided on this After Visit Summary.  MyChart is used to connect with patients for Virtual Visits (Telemedicine).  Patients are able to view lab/test results, encounter notes, upcoming appointments, etc.  Non-urgent messages can be sent to your provider as well.   To learn more about what you can do with MyChart, go to [email or web address].    Your next appointment:   6 month(s)  The format for your next appointment:   In Person  Provider:   You may see Sinclair Grooms, MD or one of the following Advanced Practice Providers on your designated Care Team:    Cecilie Kicks, NP  Kathyrn Drown, NP    Other Instructions      Signed, Sinclair Grooms, MD  05/10/2020 4:15 PM    [place]

## 2020-05-10 ENCOUNTER — Other Ambulatory Visit: Payer: Self-pay

## 2020-05-10 ENCOUNTER — Encounter: Payer: Self-pay | Admitting: Interventional Cardiology

## 2020-05-10 ENCOUNTER — Ambulatory Visit: Payer: Medicare HMO | Admitting: Interventional Cardiology

## 2020-05-10 VITALS — BP 118/72 | HR 54 | Ht 68.0 in | Wt 180.0 lb

## 2020-05-10 DIAGNOSIS — Z7189 Other specified counseling: Secondary | ICD-10-CM

## 2020-05-10 DIAGNOSIS — I110 Hypertensive heart disease with heart failure: Secondary | ICD-10-CM | POA: Diagnosis not present

## 2020-05-10 DIAGNOSIS — I5022 Chronic systolic (congestive) heart failure: Secondary | ICD-10-CM

## 2020-05-10 DIAGNOSIS — I493 Ventricular premature depolarization: Secondary | ICD-10-CM

## 2020-05-10 DIAGNOSIS — E11638 Type 2 diabetes mellitus with other oral complications: Secondary | ICD-10-CM | POA: Diagnosis not present

## 2020-05-10 DIAGNOSIS — Z79899 Other long term (current) drug therapy: Secondary | ICD-10-CM | POA: Diagnosis not present

## 2020-05-10 NOTE — Patient Instructions (Signed)
Medication Instructions:  Your physician recommends that you continue on your current medications as directed. Please refer to the Current Medication list given to you today.  *If you need a refill on your cardiac medications before your next appointment, please call your pharmacy*   Lab Work: BMET in 3 months  If you have labs (blood work) drawn today and your tests are completely normal, you will receive your results only by: Marland Kitchen MyChart Message (if you have MyChart) OR . A paper copy in the mail If you have any lab test that is abnormal or we need to change your treatment, we will call you to review the results.   Testing/Procedures: None   Follow-Up: At [facility], you and your health needs are our priority.  As part of our continuing mission to provide you with exceptional heart care, we have created designated Provider Care Teams.  These Care Teams include your primary Cardiologist (physician) and Advanced Practice Providers (APPs -  Physician Assistants and Nurse Practitioners) who all work together to provide you with the care you need, when you need it.  We recommend signing up for the patient portal called "MyChart".  Sign up information is provided on this After Visit Summary.  MyChart is used to connect with patients for Virtual Visits (Telemedicine).  Patients are able to view lab/test results, encounter notes, upcoming appointments, etc.  Non-urgent messages can be sent to your provider as well.   To learn more about what you can do with MyChart, go to [email or web address].    Your next appointment:   6 month(s)  The format for your next appointment:   In Person  Provider:   You may see Sinclair Grooms, MD or one of the following Advanced Practice Providers on your designated Care Team:    Cecilie Kicks, NP  Kathyrn Drown, NP    Other Instructions

## 2020-06-04 ENCOUNTER — Other Ambulatory Visit: Payer: Self-pay | Admitting: Interventional Cardiology

## 2020-06-18 ENCOUNTER — Other Ambulatory Visit: Payer: Self-pay | Admitting: Interventional Cardiology

## 2020-06-21 DIAGNOSIS — E782 Mixed hyperlipidemia: Secondary | ICD-10-CM | POA: Diagnosis not present

## 2020-06-21 DIAGNOSIS — N400 Enlarged prostate without lower urinary tract symptoms: Secondary | ICD-10-CM | POA: Diagnosis not present

## 2020-06-21 DIAGNOSIS — I10 Essential (primary) hypertension: Secondary | ICD-10-CM | POA: Diagnosis not present

## 2020-06-21 DIAGNOSIS — I5020 Unspecified systolic (congestive) heart failure: Secondary | ICD-10-CM | POA: Diagnosis not present

## 2020-06-21 DIAGNOSIS — N182 Chronic kidney disease, stage 2 (mild): Secondary | ICD-10-CM | POA: Diagnosis not present

## 2020-06-21 DIAGNOSIS — E039 Hypothyroidism, unspecified: Secondary | ICD-10-CM | POA: Diagnosis not present

## 2020-06-21 DIAGNOSIS — J449 Chronic obstructive pulmonary disease, unspecified: Secondary | ICD-10-CM | POA: Diagnosis not present

## 2020-06-21 DIAGNOSIS — E1122 Type 2 diabetes mellitus with diabetic chronic kidney disease: Secondary | ICD-10-CM | POA: Diagnosis not present

## 2020-06-24 DIAGNOSIS — I5020 Unspecified systolic (congestive) heart failure: Secondary | ICD-10-CM | POA: Diagnosis not present

## 2020-06-24 DIAGNOSIS — J449 Chronic obstructive pulmonary disease, unspecified: Secondary | ICD-10-CM | POA: Diagnosis not present

## 2020-06-24 DIAGNOSIS — E039 Hypothyroidism, unspecified: Secondary | ICD-10-CM | POA: Diagnosis not present

## 2020-06-24 DIAGNOSIS — N182 Chronic kidney disease, stage 2 (mild): Secondary | ICD-10-CM | POA: Diagnosis not present

## 2020-06-24 DIAGNOSIS — E1122 Type 2 diabetes mellitus with diabetic chronic kidney disease: Secondary | ICD-10-CM | POA: Diagnosis not present

## 2020-06-24 DIAGNOSIS — I10 Essential (primary) hypertension: Secondary | ICD-10-CM | POA: Diagnosis not present

## 2020-07-23 ENCOUNTER — Other Ambulatory Visit: Payer: Self-pay | Admitting: Interventional Cardiology

## 2020-07-23 DIAGNOSIS — J449 Chronic obstructive pulmonary disease, unspecified: Secondary | ICD-10-CM | POA: Diagnosis not present

## 2020-07-23 DIAGNOSIS — I5020 Unspecified systolic (congestive) heart failure: Secondary | ICD-10-CM | POA: Diagnosis not present

## 2020-07-23 DIAGNOSIS — N182 Chronic kidney disease, stage 2 (mild): Secondary | ICD-10-CM | POA: Diagnosis not present

## 2020-07-23 DIAGNOSIS — E1122 Type 2 diabetes mellitus with diabetic chronic kidney disease: Secondary | ICD-10-CM | POA: Diagnosis not present

## 2020-07-23 DIAGNOSIS — N400 Enlarged prostate without lower urinary tract symptoms: Secondary | ICD-10-CM | POA: Diagnosis not present

## 2020-07-23 DIAGNOSIS — I10 Essential (primary) hypertension: Secondary | ICD-10-CM | POA: Diagnosis not present

## 2020-07-23 DIAGNOSIS — E782 Mixed hyperlipidemia: Secondary | ICD-10-CM | POA: Diagnosis not present

## 2020-07-23 DIAGNOSIS — E039 Hypothyroidism, unspecified: Secondary | ICD-10-CM | POA: Diagnosis not present

## 2020-07-29 ENCOUNTER — Other Ambulatory Visit (HOSPITAL_COMMUNITY): Payer: Self-pay | Admitting: Student

## 2020-08-08 ENCOUNTER — Other Ambulatory Visit: Payer: Medicare HMO | Admitting: *Deleted

## 2020-08-08 ENCOUNTER — Other Ambulatory Visit: Payer: Self-pay

## 2020-08-08 DIAGNOSIS — Z79899 Other long term (current) drug therapy: Secondary | ICD-10-CM

## 2020-08-08 DIAGNOSIS — I5022 Chronic systolic (congestive) heart failure: Secondary | ICD-10-CM

## 2020-08-08 LAB — BASIC METABOLIC PANEL
BUN/Creatinine Ratio: 13 (ref 10–24)
BUN: 17 mg/dL (ref 8–27)
CO2: 26 mmol/L (ref 20–29)
Calcium: 9.1 mg/dL (ref 8.6–10.2)
Chloride: 101 mmol/L (ref 96–106)
Creatinine, Ser: 1.34 mg/dL — ABNORMAL HIGH (ref 0.76–1.27)
Glucose: 102 mg/dL — ABNORMAL HIGH (ref 65–99)
Potassium: 4.3 mmol/L (ref 3.5–5.2)
Sodium: 142 mmol/L (ref 134–144)
eGFR: 53 mL/min/{1.73_m2} — ABNORMAL LOW (ref >59)

## 2020-08-08 LAB — HEPATIC FUNCTION PANEL
ALT: 9 IU/L (ref 0–44)
AST: 14 IU/L (ref 0–40)
Albumin: 4 g/dL (ref 3.6–4.6)
Alkaline Phosphatase: 91 IU/L (ref 44–121)
Bilirubin Total: 0.3 mg/dL (ref 0.0–1.2)
Bilirubin, Direct: 0.15 mg/dL (ref 0.00–0.40)
Total Protein: 7 g/dL (ref 6.0–8.5)

## 2020-08-08 LAB — TSH: TSH: 1.61 u[IU]/mL (ref 0.450–4.500)

## 2020-08-14 DIAGNOSIS — N182 Chronic kidney disease, stage 2 (mild): Secondary | ICD-10-CM | POA: Diagnosis not present

## 2020-08-14 DIAGNOSIS — N400 Enlarged prostate without lower urinary tract symptoms: Secondary | ICD-10-CM | POA: Diagnosis not present

## 2020-08-14 DIAGNOSIS — I5020 Unspecified systolic (congestive) heart failure: Secondary | ICD-10-CM | POA: Diagnosis not present

## 2020-08-14 DIAGNOSIS — E039 Hypothyroidism, unspecified: Secondary | ICD-10-CM | POA: Diagnosis not present

## 2020-08-14 DIAGNOSIS — E782 Mixed hyperlipidemia: Secondary | ICD-10-CM | POA: Diagnosis not present

## 2020-08-14 DIAGNOSIS — E1122 Type 2 diabetes mellitus with diabetic chronic kidney disease: Secondary | ICD-10-CM | POA: Diagnosis not present

## 2020-08-14 DIAGNOSIS — J449 Chronic obstructive pulmonary disease, unspecified: Secondary | ICD-10-CM | POA: Diagnosis not present

## 2020-08-14 DIAGNOSIS — I10 Essential (primary) hypertension: Secondary | ICD-10-CM | POA: Diagnosis not present

## 2020-09-06 DIAGNOSIS — N182 Chronic kidney disease, stage 2 (mild): Secondary | ICD-10-CM | POA: Diagnosis not present

## 2020-09-06 DIAGNOSIS — I5020 Unspecified systolic (congestive) heart failure: Secondary | ICD-10-CM | POA: Diagnosis not present

## 2020-09-06 DIAGNOSIS — E039 Hypothyroidism, unspecified: Secondary | ICD-10-CM | POA: Diagnosis not present

## 2020-09-06 DIAGNOSIS — J449 Chronic obstructive pulmonary disease, unspecified: Secondary | ICD-10-CM | POA: Diagnosis not present

## 2020-09-06 DIAGNOSIS — E782 Mixed hyperlipidemia: Secondary | ICD-10-CM | POA: Diagnosis not present

## 2020-09-06 DIAGNOSIS — N400 Enlarged prostate without lower urinary tract symptoms: Secondary | ICD-10-CM | POA: Diagnosis not present

## 2020-09-06 DIAGNOSIS — E1122 Type 2 diabetes mellitus with diabetic chronic kidney disease: Secondary | ICD-10-CM | POA: Diagnosis not present

## 2020-09-06 DIAGNOSIS — I10 Essential (primary) hypertension: Secondary | ICD-10-CM | POA: Diagnosis not present

## 2020-09-12 DIAGNOSIS — E039 Hypothyroidism, unspecified: Secondary | ICD-10-CM | POA: Diagnosis not present

## 2020-09-12 DIAGNOSIS — Z1389 Encounter for screening for other disorder: Secondary | ICD-10-CM | POA: Diagnosis not present

## 2020-09-12 DIAGNOSIS — E782 Mixed hyperlipidemia: Secondary | ICD-10-CM | POA: Diagnosis not present

## 2020-09-12 DIAGNOSIS — N1831 Chronic kidney disease, stage 3a: Secondary | ICD-10-CM | POA: Diagnosis not present

## 2020-09-12 DIAGNOSIS — Z23 Encounter for immunization: Secondary | ICD-10-CM | POA: Diagnosis not present

## 2020-09-12 DIAGNOSIS — I5020 Unspecified systolic (congestive) heart failure: Secondary | ICD-10-CM | POA: Diagnosis not present

## 2020-09-12 DIAGNOSIS — E1122 Type 2 diabetes mellitus with diabetic chronic kidney disease: Secondary | ICD-10-CM | POA: Diagnosis not present

## 2020-09-12 DIAGNOSIS — I10 Essential (primary) hypertension: Secondary | ICD-10-CM | POA: Diagnosis not present

## 2020-09-12 DIAGNOSIS — I493 Ventricular premature depolarization: Secondary | ICD-10-CM | POA: Diagnosis not present

## 2020-09-12 DIAGNOSIS — J449 Chronic obstructive pulmonary disease, unspecified: Secondary | ICD-10-CM | POA: Diagnosis not present

## 2020-09-12 DIAGNOSIS — Z0000 Encounter for general adult medical examination without abnormal findings: Secondary | ICD-10-CM | POA: Diagnosis not present

## 2020-10-18 ENCOUNTER — Telehealth: Payer: Self-pay | Admitting: Interventional Cardiology

## 2020-10-18 DIAGNOSIS — I5020 Unspecified systolic (congestive) heart failure: Secondary | ICD-10-CM | POA: Diagnosis not present

## 2020-10-18 DIAGNOSIS — N182 Chronic kidney disease, stage 2 (mild): Secondary | ICD-10-CM | POA: Diagnosis not present

## 2020-10-18 DIAGNOSIS — I10 Essential (primary) hypertension: Secondary | ICD-10-CM | POA: Diagnosis not present

## 2020-10-18 DIAGNOSIS — E1122 Type 2 diabetes mellitus with diabetic chronic kidney disease: Secondary | ICD-10-CM | POA: Diagnosis not present

## 2020-10-18 DIAGNOSIS — N1831 Chronic kidney disease, stage 3a: Secondary | ICD-10-CM | POA: Diagnosis not present

## 2020-10-18 DIAGNOSIS — E782 Mixed hyperlipidemia: Secondary | ICD-10-CM | POA: Diagnosis not present

## 2020-10-18 DIAGNOSIS — J449 Chronic obstructive pulmonary disease, unspecified: Secondary | ICD-10-CM | POA: Diagnosis not present

## 2020-10-18 DIAGNOSIS — E039 Hypothyroidism, unspecified: Secondary | ICD-10-CM | POA: Diagnosis not present

## 2020-10-18 DIAGNOSIS — N400 Enlarged prostate without lower urinary tract symptoms: Secondary | ICD-10-CM | POA: Diagnosis not present

## 2020-10-18 MED ORDER — CARVEDILOL 6.25 MG PO TABS: 6.2500 mg | ORAL_TABLET | Freq: Two times a day (BID) | ORAL | 1 refills | Status: DC

## 2020-10-18 MED ORDER — POTASSIUM CHLORIDE CRYS ER 20 MEQ PO TBCR: EXTENDED_RELEASE_TABLET | ORAL | 1 refills | Status: DC

## 2020-10-18 MED ORDER — AMIODARONE HCL 200 MG PO TABS: 200.0000 mg | ORAL_TABLET | Freq: Every day | ORAL | 1 refills | Status: DC

## 2020-10-18 MED ORDER — FUROSEMIDE 40 MG PO TABS: 40.0000 mg | ORAL_TABLET | Freq: Two times a day (BID) | ORAL | 1 refills | Status: DC

## 2020-10-18 MED ORDER — ENTRESTO 49-51 MG PO TABS: ORAL_TABLET | ORAL | 5 refills | Status: DC

## 2020-10-18 NOTE — Telephone Encounter (Signed)
[facility] reaching out to determine if pt is still supposed to be taking Potassium ER 20 eq... edu that I dont see this on his chart but will send to nurse for confirmation... please advise

## 2020-10-18 NOTE — Telephone Encounter (Signed)
Pt's medications were sent to pt's new pharmacy as requested. Confirmation received.  

## 2020-11-01 DIAGNOSIS — I5020 Unspecified systolic (congestive) heart failure: Secondary | ICD-10-CM | POA: Diagnosis not present

## 2020-11-01 DIAGNOSIS — I10 Essential (primary) hypertension: Secondary | ICD-10-CM | POA: Diagnosis not present

## 2020-11-20 DIAGNOSIS — J449 Chronic obstructive pulmonary disease, unspecified: Secondary | ICD-10-CM | POA: Diagnosis not present

## 2020-11-20 DIAGNOSIS — E1122 Type 2 diabetes mellitus with diabetic chronic kidney disease: Secondary | ICD-10-CM | POA: Diagnosis not present

## 2020-11-20 DIAGNOSIS — N400 Enlarged prostate without lower urinary tract symptoms: Secondary | ICD-10-CM | POA: Diagnosis not present

## 2020-11-20 DIAGNOSIS — N1831 Chronic kidney disease, stage 3a: Secondary | ICD-10-CM | POA: Diagnosis not present

## 2020-11-20 DIAGNOSIS — I10 Essential (primary) hypertension: Secondary | ICD-10-CM | POA: Diagnosis not present

## 2020-11-20 DIAGNOSIS — N182 Chronic kidney disease, stage 2 (mild): Secondary | ICD-10-CM | POA: Diagnosis not present

## 2020-11-20 DIAGNOSIS — E039 Hypothyroidism, unspecified: Secondary | ICD-10-CM | POA: Diagnosis not present

## 2020-11-20 DIAGNOSIS — E782 Mixed hyperlipidemia: Secondary | ICD-10-CM | POA: Diagnosis not present

## 2020-11-20 DIAGNOSIS — I5020 Unspecified systolic (congestive) heart failure: Secondary | ICD-10-CM | POA: Diagnosis not present

## 2020-12-20 DIAGNOSIS — I5020 Unspecified systolic (congestive) heart failure: Secondary | ICD-10-CM | POA: Diagnosis not present

## 2020-12-20 DIAGNOSIS — E1122 Type 2 diabetes mellitus with diabetic chronic kidney disease: Secondary | ICD-10-CM | POA: Diagnosis not present

## 2020-12-20 DIAGNOSIS — E782 Mixed hyperlipidemia: Secondary | ICD-10-CM | POA: Diagnosis not present

## 2020-12-20 DIAGNOSIS — I10 Essential (primary) hypertension: Secondary | ICD-10-CM | POA: Diagnosis not present

## 2020-12-20 DIAGNOSIS — E039 Hypothyroidism, unspecified: Secondary | ICD-10-CM | POA: Diagnosis not present

## 2020-12-20 DIAGNOSIS — J449 Chronic obstructive pulmonary disease, unspecified: Secondary | ICD-10-CM | POA: Diagnosis not present

## 2020-12-20 DIAGNOSIS — N182 Chronic kidney disease, stage 2 (mild): Secondary | ICD-10-CM | POA: Diagnosis not present

## 2020-12-20 DIAGNOSIS — N400 Enlarged prostate without lower urinary tract symptoms: Secondary | ICD-10-CM | POA: Diagnosis not present

## 2020-12-20 DIAGNOSIS — N1831 Chronic kidney disease, stage 3a: Secondary | ICD-10-CM | POA: Diagnosis not present

## 2020-12-25 DIAGNOSIS — I10 Essential (primary) hypertension: Secondary | ICD-10-CM | POA: Diagnosis not present

## 2021-01-20 DIAGNOSIS — N1831 Chronic kidney disease, stage 3a: Secondary | ICD-10-CM | POA: Diagnosis not present

## 2021-01-20 DIAGNOSIS — I10 Essential (primary) hypertension: Secondary | ICD-10-CM | POA: Diagnosis not present

## 2021-01-20 DIAGNOSIS — E039 Hypothyroidism, unspecified: Secondary | ICD-10-CM | POA: Diagnosis not present

## 2021-01-20 DIAGNOSIS — N182 Chronic kidney disease, stage 2 (mild): Secondary | ICD-10-CM | POA: Diagnosis not present

## 2021-01-20 DIAGNOSIS — E782 Mixed hyperlipidemia: Secondary | ICD-10-CM | POA: Diagnosis not present

## 2021-01-20 DIAGNOSIS — N400 Enlarged prostate without lower urinary tract symptoms: Secondary | ICD-10-CM | POA: Diagnosis not present

## 2021-01-20 DIAGNOSIS — J449 Chronic obstructive pulmonary disease, unspecified: Secondary | ICD-10-CM | POA: Diagnosis not present

## 2021-01-20 DIAGNOSIS — I5020 Unspecified systolic (congestive) heart failure: Secondary | ICD-10-CM | POA: Diagnosis not present

## 2021-01-20 DIAGNOSIS — E1122 Type 2 diabetes mellitus with diabetic chronic kidney disease: Secondary | ICD-10-CM | POA: Diagnosis not present

## 2021-02-14 ENCOUNTER — Other Ambulatory Visit: Payer: Self-pay | Admitting: Interventional Cardiology

## 2021-02-18 DIAGNOSIS — E039 Hypothyroidism, unspecified: Secondary | ICD-10-CM | POA: Diagnosis not present

## 2021-02-18 DIAGNOSIS — I5020 Unspecified systolic (congestive) heart failure: Secondary | ICD-10-CM | POA: Diagnosis not present

## 2021-02-18 DIAGNOSIS — N400 Enlarged prostate without lower urinary tract symptoms: Secondary | ICD-10-CM | POA: Diagnosis not present

## 2021-02-18 DIAGNOSIS — E782 Mixed hyperlipidemia: Secondary | ICD-10-CM | POA: Diagnosis not present

## 2021-02-18 DIAGNOSIS — E1122 Type 2 diabetes mellitus with diabetic chronic kidney disease: Secondary | ICD-10-CM | POA: Diagnosis not present

## 2021-02-18 DIAGNOSIS — I10 Essential (primary) hypertension: Secondary | ICD-10-CM | POA: Diagnosis not present

## 2021-02-18 DIAGNOSIS — J449 Chronic obstructive pulmonary disease, unspecified: Secondary | ICD-10-CM | POA: Diagnosis not present

## 2021-02-18 DIAGNOSIS — N182 Chronic kidney disease, stage 2 (mild): Secondary | ICD-10-CM | POA: Diagnosis not present

## 2021-02-24 DIAGNOSIS — I10 Essential (primary) hypertension: Secondary | ICD-10-CM | POA: Diagnosis not present

## 2021-03-20 DIAGNOSIS — J449 Chronic obstructive pulmonary disease, unspecified: Secondary | ICD-10-CM | POA: Diagnosis not present

## 2021-03-20 DIAGNOSIS — N400 Enlarged prostate without lower urinary tract symptoms: Secondary | ICD-10-CM | POA: Diagnosis not present

## 2021-03-20 DIAGNOSIS — E1122 Type 2 diabetes mellitus with diabetic chronic kidney disease: Secondary | ICD-10-CM | POA: Diagnosis not present

## 2021-03-20 DIAGNOSIS — I5020 Unspecified systolic (congestive) heart failure: Secondary | ICD-10-CM | POA: Diagnosis not present

## 2021-03-20 DIAGNOSIS — E782 Mixed hyperlipidemia: Secondary | ICD-10-CM | POA: Diagnosis not present

## 2021-03-20 DIAGNOSIS — E039 Hypothyroidism, unspecified: Secondary | ICD-10-CM | POA: Diagnosis not present

## 2021-03-20 DIAGNOSIS — I10 Essential (primary) hypertension: Secondary | ICD-10-CM | POA: Diagnosis not present

## 2021-03-20 DIAGNOSIS — N182 Chronic kidney disease, stage 2 (mild): Secondary | ICD-10-CM | POA: Diagnosis not present

## 2021-03-26 DIAGNOSIS — I10 Essential (primary) hypertension: Secondary | ICD-10-CM | POA: Diagnosis not present

## 2021-04-23 DIAGNOSIS — E1122 Type 2 diabetes mellitus with diabetic chronic kidney disease: Secondary | ICD-10-CM | POA: Diagnosis not present

## 2021-04-23 DIAGNOSIS — I5020 Unspecified systolic (congestive) heart failure: Secondary | ICD-10-CM | POA: Diagnosis not present

## 2021-04-23 DIAGNOSIS — N400 Enlarged prostate without lower urinary tract symptoms: Secondary | ICD-10-CM | POA: Diagnosis not present

## 2021-04-23 DIAGNOSIS — N182 Chronic kidney disease, stage 2 (mild): Secondary | ICD-10-CM | POA: Diagnosis not present

## 2021-04-23 DIAGNOSIS — E039 Hypothyroidism, unspecified: Secondary | ICD-10-CM | POA: Diagnosis not present

## 2021-04-23 DIAGNOSIS — I10 Essential (primary) hypertension: Secondary | ICD-10-CM | POA: Diagnosis not present

## 2021-04-23 DIAGNOSIS — E782 Mixed hyperlipidemia: Secondary | ICD-10-CM | POA: Diagnosis not present

## 2021-04-23 DIAGNOSIS — J449 Chronic obstructive pulmonary disease, unspecified: Secondary | ICD-10-CM | POA: Diagnosis not present

## 2021-04-25 DIAGNOSIS — I10 Essential (primary) hypertension: Secondary | ICD-10-CM | POA: Diagnosis not present

## 2021-05-14 ENCOUNTER — Other Ambulatory Visit: Payer: Self-pay | Admitting: Interventional Cardiology

## 2021-05-19 DIAGNOSIS — N182 Chronic kidney disease, stage 2 (mild): Secondary | ICD-10-CM | POA: Diagnosis not present

## 2021-05-19 DIAGNOSIS — E1122 Type 2 diabetes mellitus with diabetic chronic kidney disease: Secondary | ICD-10-CM | POA: Diagnosis not present

## 2021-05-19 DIAGNOSIS — J449 Chronic obstructive pulmonary disease, unspecified: Secondary | ICD-10-CM | POA: Diagnosis not present

## 2021-05-19 DIAGNOSIS — I5020 Unspecified systolic (congestive) heart failure: Secondary | ICD-10-CM | POA: Diagnosis not present

## 2021-05-19 DIAGNOSIS — I10 Essential (primary) hypertension: Secondary | ICD-10-CM | POA: Diagnosis not present

## 2021-05-19 DIAGNOSIS — E039 Hypothyroidism, unspecified: Secondary | ICD-10-CM | POA: Diagnosis not present

## 2021-05-19 DIAGNOSIS — N400 Enlarged prostate without lower urinary tract symptoms: Secondary | ICD-10-CM | POA: Diagnosis not present

## 2021-05-19 DIAGNOSIS — E782 Mixed hyperlipidemia: Secondary | ICD-10-CM | POA: Diagnosis not present

## 2021-05-27 DIAGNOSIS — I10 Essential (primary) hypertension: Secondary | ICD-10-CM | POA: Diagnosis not present

## 2021-06-16 ENCOUNTER — Other Ambulatory Visit: Payer: Self-pay | Admitting: Interventional Cardiology

## 2021-06-17 DIAGNOSIS — I10 Essential (primary) hypertension: Secondary | ICD-10-CM | POA: Diagnosis not present

## 2021-06-17 DIAGNOSIS — J449 Chronic obstructive pulmonary disease, unspecified: Secondary | ICD-10-CM | POA: Diagnosis not present

## 2021-06-17 DIAGNOSIS — E782 Mixed hyperlipidemia: Secondary | ICD-10-CM | POA: Diagnosis not present

## 2021-06-17 DIAGNOSIS — N1831 Chronic kidney disease, stage 3a: Secondary | ICD-10-CM | POA: Diagnosis not present

## 2021-06-17 DIAGNOSIS — I5020 Unspecified systolic (congestive) heart failure: Secondary | ICD-10-CM | POA: Diagnosis not present

## 2021-06-17 DIAGNOSIS — E1122 Type 2 diabetes mellitus with diabetic chronic kidney disease: Secondary | ICD-10-CM | POA: Diagnosis not present

## 2021-06-24 DIAGNOSIS — I10 Essential (primary) hypertension: Secondary | ICD-10-CM | POA: Diagnosis not present

## 2021-07-16 ENCOUNTER — Other Ambulatory Visit: Payer: Self-pay | Admitting: Interventional Cardiology

## 2021-07-17 ENCOUNTER — Telehealth: Payer: Self-pay | Admitting: Interventional Cardiology

## 2021-07-17 MED ORDER — ENTRESTO 49-51 MG PO TABS: 1.0000 | ORAL_TABLET | Freq: Two times a day (BID) | ORAL | 0 refills | Status: DC

## 2021-07-17 NOTE — Telephone Encounter (Signed)
?*  STAT* If patient is at the pharmacy, call can be transferred to refill team. ? ? ?1. Which medications need to be refilled? (please list name of each medication and dose if known)  ?sacubitril-valsartan (ENTRESTO) 49-51 MG ? ?2. Which pharmacy/location (including street and city if local pharmacy) is medication to be sent to? ?[facility] - [place], [place] - [place] [address] ? ?3. Do they need a 30 day or 90 day supply? 90 with refills ? ?Patient is scheduled to see Dr. Tamala Julian 10/06/21 at 9:00 am  ?

## 2021-07-17 NOTE — Telephone Encounter (Signed)
Pt's medication was sent to pt's pharmacy as requested. Confirmation received.  °

## 2021-07-25 DIAGNOSIS — I10 Essential (primary) hypertension: Secondary | ICD-10-CM | POA: Diagnosis not present

## 2021-08-06 ENCOUNTER — Other Ambulatory Visit: Payer: Self-pay | Admitting: Interventional Cardiology

## 2021-08-08 DIAGNOSIS — J449 Chronic obstructive pulmonary disease, unspecified: Secondary | ICD-10-CM | POA: Diagnosis not present

## 2021-08-08 DIAGNOSIS — N1831 Chronic kidney disease, stage 3a: Secondary | ICD-10-CM | POA: Diagnosis not present

## 2021-08-08 DIAGNOSIS — I5020 Unspecified systolic (congestive) heart failure: Secondary | ICD-10-CM | POA: Diagnosis not present

## 2021-08-08 DIAGNOSIS — I10 Essential (primary) hypertension: Secondary | ICD-10-CM | POA: Diagnosis not present

## 2021-08-08 DIAGNOSIS — E1122 Type 2 diabetes mellitus with diabetic chronic kidney disease: Secondary | ICD-10-CM | POA: Diagnosis not present

## 2021-08-08 DIAGNOSIS — E782 Mixed hyperlipidemia: Secondary | ICD-10-CM | POA: Diagnosis not present

## 2021-08-24 DIAGNOSIS — I10 Essential (primary) hypertension: Secondary | ICD-10-CM | POA: Diagnosis not present

## 2021-09-05 DIAGNOSIS — J449 Chronic obstructive pulmonary disease, unspecified: Secondary | ICD-10-CM | POA: Diagnosis not present

## 2021-09-05 DIAGNOSIS — I5020 Unspecified systolic (congestive) heart failure: Secondary | ICD-10-CM | POA: Diagnosis not present

## 2021-09-05 DIAGNOSIS — E1122 Type 2 diabetes mellitus with diabetic chronic kidney disease: Secondary | ICD-10-CM | POA: Diagnosis not present

## 2021-09-05 DIAGNOSIS — E782 Mixed hyperlipidemia: Secondary | ICD-10-CM | POA: Diagnosis not present

## 2021-09-05 DIAGNOSIS — I10 Essential (primary) hypertension: Secondary | ICD-10-CM | POA: Diagnosis not present

## 2021-09-05 DIAGNOSIS — N1831 Chronic kidney disease, stage 3a: Secondary | ICD-10-CM | POA: Diagnosis not present

## 2021-09-15 DIAGNOSIS — I110 Hypertensive heart disease with heart failure: Secondary | ICD-10-CM | POA: Diagnosis not present

## 2021-09-15 DIAGNOSIS — J449 Chronic obstructive pulmonary disease, unspecified: Secondary | ICD-10-CM | POA: Diagnosis not present

## 2021-09-15 DIAGNOSIS — E039 Hypothyroidism, unspecified: Secondary | ICD-10-CM | POA: Diagnosis not present

## 2021-09-15 DIAGNOSIS — E782 Mixed hyperlipidemia: Secondary | ICD-10-CM | POA: Diagnosis not present

## 2021-09-15 DIAGNOSIS — Z1331 Encounter for screening for depression: Secondary | ICD-10-CM | POA: Diagnosis not present

## 2021-09-15 DIAGNOSIS — I5020 Unspecified systolic (congestive) heart failure: Secondary | ICD-10-CM | POA: Diagnosis not present

## 2021-09-15 DIAGNOSIS — E1122 Type 2 diabetes mellitus with diabetic chronic kidney disease: Secondary | ICD-10-CM | POA: Diagnosis not present

## 2021-09-15 DIAGNOSIS — N1831 Chronic kidney disease, stage 3a: Secondary | ICD-10-CM | POA: Diagnosis not present

## 2021-09-15 DIAGNOSIS — Z0000 Encounter for general adult medical examination without abnormal findings: Secondary | ICD-10-CM | POA: Diagnosis not present

## 2021-09-15 DIAGNOSIS — N400 Enlarged prostate without lower urinary tract symptoms: Secondary | ICD-10-CM | POA: Diagnosis not present

## 2021-09-15 DIAGNOSIS — I10 Essential (primary) hypertension: Secondary | ICD-10-CM | POA: Diagnosis not present

## 2021-09-24 DIAGNOSIS — I10 Essential (primary) hypertension: Secondary | ICD-10-CM | POA: Diagnosis not present

## 2021-10-05 NOTE — Progress Notes (Unsigned)
Cardiology Office Note:    Date:  10/06/2021   ID:  [name], DOB 02/01/1935, MRN [id number]  PCP:  [name], MD  Cardiologist:  Sinclair Grooms, MD   Referring MD: [name], MD   Chief Complaint  Patient presents with   Congestive Heart Failure   Coronary Artery Disease   Follow-up    PVCs with amiodarone for suppression    History of Present Illness:    Bronco Mcgrory is a 87 y.o. male with a hx of chronic systolic and diastolic heart failure felt secondary to hypertension, frequent PVCs (amiodarone for suppression-Dr. Lovena Le), and EF 25-30% 7/ 2019 -> 40% 02/2018.   Accompanied by his son.  Voices no complaints.  Has lost a little weight.  Still eats.  No chest pain, orthopnea, PND, dyspnea on exertion, or lower extremity swelling.  No medication side effects.  Past Medical History:  Diagnosis Date   Adenomatous polyp    POSITIVE, COLON 7/18, [place] COLON NEGATIVE   Allergic rhinitis    BPH (benign prostatic hyperplasia)    MICROSCOPIC HEMATURIA WORKUP IN 2004 WITH UROLOGY NEGATIVE, DR. Karsten Ro   CHF (congestive heart failure) ([place])    EPISODE IN 1994 SECONDARY TO HYPERTENSION, ECHO 2005 NORMAL LV FUNCTION   CKD (chronic kidney disease)    STAGE 2 MICROALBUMIN POSITIVE   Colon polyp    COLON IN 2018   COPD (chronic obstructive pulmonary disease) ([place])    ON X-RAY, CIGAR USE--PFT IN 2013 NORMAL   Diabetes type 2, controlled ([place])    Dyslipidemia    Dyspnea    ED (erectile dysfunction)    Hypertension    Patellar bursitis of right knee    PRE-PATELLA BURSITIS WITH INTERMITTENT SWELLING     Past Surgical History:  Procedure Laterality Date   COLONOSCOPY  12/2010   10/2016   INGUINAL HERNIA REPAIR Right    1976    Current Medications: Current Meds  Medication Sig   amiodarone (PACERONE) 100 MG tablet Take 1 tablet (100 mg total) by mouth daily.   aspirin EC 81 MG tablet Take 81 mg by mouth daily.   budesonide-formoterol (SYMBICORT)  80-4.5 MCG/ACT inhaler SMARTSIG:By Mouth   carvedilol (COREG) 6.25 MG tablet Take 1 tablet (6.25 mg total) by mouth 2 (two) times daily with a meal.   FARXIGA 10 MG TABS tablet Take 10 mg by mouth daily.   fluticasone (FLONASE) 50 MCG/ACT nasal spray Place 1 spray into both nostrils daily as needed for allergies.    furosemide (LASIX) 40 MG tablet TAKE ONE TABLET BY MOUTH TWICE DAILY take an additional t if you gain 2 lbs or more   glimepiride (AMARYL) 1 MG tablet Take 1 mg by mouth daily with breakfast.   JARDIANCE 10 MG TABS tablet Take 10 mg by mouth every morning.   levothyroxine (SYNTHROID) 50 MCG tablet Take 50 mcg by mouth every morning.   potassium chloride SA (KLOR-CON M) 20 MEQ tablet TAKE ONE TABLET BY MOUTH EVERY MORNING   sacubitril-valsartan (ENTRESTO) 49-51 MG Take 1 tablet by mouth 2 (two) times daily. Please keep upcoming appt with Dr. Tamala Julian in June 2023 before anymore refills. Thank you Final attempt   simvastatin (ZOCOR) 10 MG tablet Take 10 mg by mouth at bedtime.   [DISCONTINUED] amiodarone (PACERONE) 200 MG tablet TAKE ONE TABLET BY MOUTH EVERY MORNING     Allergies:   Metformin and related and Penicillins   Social History   Socioeconomic History   Marital  status: Married    Spouse name: Not on file   Number of children: 7   Years of education: Not on file   Highest education level: Not on file  Occupational History   Occupation: CARPENTER  Tobacco Use   Smoking status: Every Day    Types: Cigars   Smokeless tobacco: Never  Vaping Use   Vaping Use: Never used  Substance and Sexual Activity   Alcohol use: Yes    Comment: OCCASIONAL   Drug use: Never   Sexual activity: Not on file  Other Topics Concern   Not on file  Social History Narrative   Not on file   Social Determinants of Health   Financial Resource Strain: Not on file  Food Insecurity: Not on file  Transportation Needs: Not on file  Physical Activity: Not on file  Stress: Not on file   Social Connections: Not on file     Family History: The patient's family history includes CAD in his father; Congestive Heart Failure in his sister; Congestive Heart Failure (age of onset: 62) in his mother; Heart attack (age of onset: 39) in his father; Heart failure in his sister; Kidney failure in his brother; Other in his brother; Throat cancer in his brother. There is no history of Colon cancer, Colon polyps, or Liver disease.  ROS:   Please see the history of present illness.    Compliant with medication regimen.  Arthritis in knees prevents activity.  Wants a handicap parking sticker.  All other systems reviewed and are negative.  EKGs/Labs/Other Studies Reviewed:    The following studies were reviewed today: 2D Doppler echocardiogram 2021: IMPRESSIONS     1. Left ventricular ejection fraction, by estimation, is 35 to 40%. The  left ventricle has moderately decreased function. The left ventricle  demonstrates global hypokinesis. There is moderate asymmetric left  ventricular hypertrophy. Left ventricular  diastolic parameters are consistent with Grade I diastolic dysfunction  (impaired relaxation).   2. Right ventricular systolic function is normal. The right ventricular  size is normal. Tricuspid regurgitation signal is inadequate for assessing  PA pressure.   3. Left atrial size was mildly dilated.   4. The mitral valve is normal in structure. No evidence of mitral valve  regurgitation.   5. The aortic valve is normal in structure. Aortic valve regurgitation is  trivial. No aortic stenosis is present.   6. Aortic dilatation noted. There is mild dilatation of the ascending  aorta measuring 38 mm.   7. The inferior vena cava is normal in size with greater than 50%  respiratory variability, suggesting right atrial pressure of 3 mmHg.   8. Compared to prior TTE on 08/19/93, LV systolic function has improved   EKG:  EKG normal sinus rhythm, first-degree AV block, right  bundle, left posterior hemiblock, and prominent voltage consistent with LVH.  Compared to prior, PR interval has increased to 278 ms compared to 256 in January 2022.  Recent Labs: No results found for requested labs within last 365 days.  Recent Lipid Panel No results found for: "CHOL", "TRIG", "HDL", "CHOLHDL", "VLDL", "LDLCALC", "LDLDIRECT"  Physical Exam:    VS:  BP 140/84   Pulse (!) 58   Ht '5\' 8"'$  (1.727 m)   Wt 172 lb 6.4 oz (78.2 kg)   BMI 26.21 kg/m     Wt Readings from Last 3 Encounters:  10/06/21 172 lb 6.4 oz (78.2 kg)  05/10/20 180 lb (81.6 kg)  05/02/20 198 lb (89.8 kg)  GEN: Compatible with age. No acute distress HEENT: Normal NECK: No JVD. LYMPHATICS: No lymphadenopathy CARDIAC: No murmur. RRR no gallop, or edema. VASCULAR:  Normal Pulses. No bruits. RESPIRATORY:  Clear to auscultation without rales, wheezing or rhonchi  ABDOMEN: Soft, non-tender, non-distended, No pulsatile mass, MUSCULOSKELETAL: No deformity  SKIN: Warm and dry NEUROLOGIC:  Alert and oriented x 3 PSYCHIATRIC:  Normal affect   ASSESSMENT:    1. Chronic systolic heart failure ([place])   2. Hypertensive heart disease with heart failure ([place])   3. Controlled type 2 diabetes mellitus with other oral complication, without long-term current use of insulin ([place])   4. PVC (premature ventricular contraction)   5. On amiodarone therapy    PLAN:    In order of problems listed above:  Continue current heart failure regimen which includes Jardiance 10 mg daily sacubitril/valsartan 49/51 mg twice daily, carvedilol 6.25 mg daily.  Not on MRA. Heart failure therapy is helping to manage blood pressure acceptably for age.  He also uses furosemide 40 mg daily. Most recent hemoglobin A1c was 6.6. PVC suppression with amiodarone 200 mg daily.  Started by Dr. Lovena Le in 2020.  I will decrease amiodarone to 100 mg/day.  We need to follow and make sure he does not develop toxicity related to this medication.   Most recent TSH was 2.43. Amiodarone follow-up.  Dose reduced to minimize complications.  Medication is being used to suppress PVCs.   Overall education and awareness concerning primary/secondary risk prevention was discussed in detail: LDL less than 70, hemoglobin A1c less than 7, blood pressure target less than 130/80 mmHg, >150 minutes of moderate aerobic activity per week, avoidance of smoking, weight control (via diet and exercise), and continued surveillance/management of/for obstructive sleep apnea.   Guideline directed therapy for left ventricular systolic dysfunction: Angiotensin receptor-neprilysin inhibitor (ARNI)-Entresto; beta-blocker therapy - carvedilol, metoprolol succinate, or bisoprolol; mineralocorticoid receptor antagonist (MRA) therapy -spironolactone or eplerenone.  SGLT-2 agents -  Dapagliflozin [place]) or Empagliflozin (Jardiance).These therapies have been shown to improve clinical outcomes including reduction of rehospitalization, survival, and acute heart failure.    Medication Adjustments/Labs and Tests Ordered: Current medicines are reviewed at length with the patient today.  Concerns regarding medicines are outlined above.  Orders Placed This Encounter  Procedures   EKG 12-Lead   Meds ordered this encounter  Medications   amiodarone (PACERONE) 100 MG tablet    Sig: Take 1 tablet (100 mg total) by mouth daily.    Dispense:  90 tablet    Refill:  3    Dose change    Patient Instructions  Medication Instructions:  Your physician has recommended you make the following change in your medication:   1) DECREASE Amiodarone to '100mg'$  daily  *If you need a refill on your cardiac medications before your next appointment, please call your pharmacy*  Lab Work: NONE  Testing/Procedures: NONE  Follow-Up: At [facility], you and your health needs are our priority.  As part of our continuing mission to provide you with exceptional heart care, we have created  designated Provider Care Teams.  These Care Teams include your primary Cardiologist (physician) and Advanced Practice Providers (APPs -  Physician Assistants and Nurse Practitioners) who all work together to provide you with the care you need, when you need it.  Your next appointment:   1 year(s)  The format for your next appointment:   In Person  Provider:   Sinclair Grooms, MD {   Important Information About Sugar  Signed, Sinclair Grooms, MD  10/06/2021 10:31 AM    [place]

## 2021-10-06 ENCOUNTER — Encounter: Payer: Self-pay | Admitting: Interventional Cardiology

## 2021-10-06 ENCOUNTER — Ambulatory Visit: Payer: Medicare HMO | Admitting: Interventional Cardiology

## 2021-10-06 VITALS — BP 140/84 | HR 58 | Ht 68.0 in | Wt 172.4 lb

## 2021-10-06 DIAGNOSIS — I110 Hypertensive heart disease with heart failure: Secondary | ICD-10-CM | POA: Diagnosis not present

## 2021-10-06 DIAGNOSIS — Z79899 Other long term (current) drug therapy: Secondary | ICD-10-CM | POA: Diagnosis not present

## 2021-10-06 DIAGNOSIS — I493 Ventricular premature depolarization: Secondary | ICD-10-CM

## 2021-10-06 DIAGNOSIS — E11638 Type 2 diabetes mellitus with other oral complications: Secondary | ICD-10-CM | POA: Diagnosis not present

## 2021-10-06 DIAGNOSIS — I5022 Chronic systolic (congestive) heart failure: Secondary | ICD-10-CM

## 2021-10-06 MED ORDER — AMIODARONE HCL 100 MG PO TABS
100.0000 mg | ORAL_TABLET | Freq: Every day | ORAL | 3 refills | Status: DC
Start: 2021-10-06 — End: 2022-10-08

## 2021-10-06 NOTE — Patient Instructions (Signed)
Medication Instructions:  Your physician has recommended you make the following change in your medication:   1) DECREASE Amiodarone to '100mg'$  daily  *If you need a refill on your cardiac medications before your next appointment, please call your pharmacy*  Lab Work: NONE  Testing/Procedures: NONE  Follow-Up: At [facility], you and your health needs are our priority.  As part of our continuing mission to provide you with exceptional heart care, we have created designated Provider Care Teams.  These Care Teams include your primary Cardiologist (physician) and Advanced Practice Providers (APPs -  Physician Assistants and Nurse Practitioners) who all work together to provide you with the care you need, when you need it.  Your next appointment:   1 year(s)  The format for your next appointment:   In Person  Provider:   Sinclair Grooms, MD {   Important Information About Sugar

## 2021-10-16 ENCOUNTER — Other Ambulatory Visit: Payer: Self-pay

## 2021-10-16 DIAGNOSIS — E782 Mixed hyperlipidemia: Secondary | ICD-10-CM | POA: Diagnosis not present

## 2021-10-16 DIAGNOSIS — I110 Hypertensive heart disease with heart failure: Secondary | ICD-10-CM | POA: Diagnosis not present

## 2021-10-16 DIAGNOSIS — N400 Enlarged prostate without lower urinary tract symptoms: Secondary | ICD-10-CM | POA: Diagnosis not present

## 2021-10-16 DIAGNOSIS — J449 Chronic obstructive pulmonary disease, unspecified: Secondary | ICD-10-CM | POA: Diagnosis not present

## 2021-10-16 DIAGNOSIS — E1122 Type 2 diabetes mellitus with diabetic chronic kidney disease: Secondary | ICD-10-CM | POA: Diagnosis not present

## 2021-10-16 DIAGNOSIS — N1831 Chronic kidney disease, stage 3a: Secondary | ICD-10-CM | POA: Diagnosis not present

## 2021-10-16 DIAGNOSIS — E039 Hypothyroidism, unspecified: Secondary | ICD-10-CM | POA: Diagnosis not present

## 2021-10-16 DIAGNOSIS — I10 Essential (primary) hypertension: Secondary | ICD-10-CM | POA: Diagnosis not present

## 2021-10-16 MED ORDER — ENTRESTO 49-51 MG PO TABS: 1.0000 | ORAL_TABLET | Freq: Two times a day (BID) | ORAL | 3 refills | Status: DC

## 2021-10-22 DIAGNOSIS — H5203 Hypermetropia, bilateral: Secondary | ICD-10-CM | POA: Diagnosis not present

## 2021-10-24 DIAGNOSIS — I10 Essential (primary) hypertension: Secondary | ICD-10-CM | POA: Diagnosis not present

## 2021-11-13 DIAGNOSIS — E039 Hypothyroidism, unspecified: Secondary | ICD-10-CM | POA: Diagnosis not present

## 2021-11-13 DIAGNOSIS — I5020 Unspecified systolic (congestive) heart failure: Secondary | ICD-10-CM | POA: Diagnosis not present

## 2021-11-13 DIAGNOSIS — I10 Essential (primary) hypertension: Secondary | ICD-10-CM | POA: Diagnosis not present

## 2021-11-13 DIAGNOSIS — N1831 Chronic kidney disease, stage 3a: Secondary | ICD-10-CM | POA: Diagnosis not present

## 2021-11-13 DIAGNOSIS — E782 Mixed hyperlipidemia: Secondary | ICD-10-CM | POA: Diagnosis not present

## 2021-11-13 DIAGNOSIS — E1122 Type 2 diabetes mellitus with diabetic chronic kidney disease: Secondary | ICD-10-CM | POA: Diagnosis not present

## 2021-11-21 ENCOUNTER — Other Ambulatory Visit: Payer: Self-pay | Admitting: Interventional Cardiology

## 2021-11-24 DIAGNOSIS — I10 Essential (primary) hypertension: Secondary | ICD-10-CM | POA: Diagnosis not present

## 2021-11-27 DIAGNOSIS — I10 Essential (primary) hypertension: Secondary | ICD-10-CM | POA: Diagnosis not present

## 2021-12-16 DIAGNOSIS — N1831 Chronic kidney disease, stage 3a: Secondary | ICD-10-CM | POA: Diagnosis not present

## 2021-12-16 DIAGNOSIS — J449 Chronic obstructive pulmonary disease, unspecified: Secondary | ICD-10-CM | POA: Diagnosis not present

## 2021-12-16 DIAGNOSIS — I10 Essential (primary) hypertension: Secondary | ICD-10-CM | POA: Diagnosis not present

## 2021-12-16 DIAGNOSIS — I110 Hypertensive heart disease with heart failure: Secondary | ICD-10-CM | POA: Diagnosis not present

## 2021-12-16 DIAGNOSIS — E782 Mixed hyperlipidemia: Secondary | ICD-10-CM | POA: Diagnosis not present

## 2021-12-16 DIAGNOSIS — E1122 Type 2 diabetes mellitus with diabetic chronic kidney disease: Secondary | ICD-10-CM | POA: Diagnosis not present

## 2021-12-19 ENCOUNTER — Emergency Department (HOSPITAL_COMMUNITY): Payer: Medicare HMO

## 2021-12-19 ENCOUNTER — Inpatient Hospital Stay (HOSPITAL_COMMUNITY)
Admission: EM | Admit: 2021-12-19 | Discharge: 2021-12-21 | DRG: 291 | Disposition: A | Discharge status: Home / Self Care | Payer: Medicare HMO | Attending: Internal Medicine | Admitting: Internal Medicine

## 2021-12-19 ENCOUNTER — Other Ambulatory Visit: Payer: Self-pay

## 2021-12-19 ENCOUNTER — Inpatient Hospital Stay (HOSPITAL_COMMUNITY): Payer: Medicare HMO

## 2021-12-19 ENCOUNTER — Encounter (HOSPITAL_COMMUNITY): Payer: Self-pay

## 2021-12-19 DIAGNOSIS — T380X5A Adverse effect of glucocorticoids and synthetic analogues, initial encounter: Secondary | ICD-10-CM | POA: Diagnosis present

## 2021-12-19 DIAGNOSIS — E1122 Type 2 diabetes mellitus with diabetic chronic kidney disease: Secondary | ICD-10-CM | POA: Diagnosis present

## 2021-12-19 DIAGNOSIS — J9601 Acute respiratory failure with hypoxia: Secondary | ICD-10-CM | POA: Diagnosis not present

## 2021-12-19 DIAGNOSIS — Z888 Allergy status to other drugs, medicaments and biological substances status: Secondary | ICD-10-CM | POA: Diagnosis not present

## 2021-12-19 DIAGNOSIS — Z8249 Family history of ischemic heart disease and other diseases of the circulatory system: Secondary | ICD-10-CM | POA: Diagnosis not present

## 2021-12-19 DIAGNOSIS — F17290 Nicotine dependence, other tobacco product, uncomplicated: Secondary | ICD-10-CM | POA: Diagnosis not present

## 2021-12-19 DIAGNOSIS — I10 Essential (primary) hypertension: Secondary | ICD-10-CM | POA: Diagnosis not present

## 2021-12-19 DIAGNOSIS — J9602 Acute respiratory failure with hypercapnia: Secondary | ICD-10-CM | POA: Diagnosis present

## 2021-12-19 DIAGNOSIS — I5043 Acute on chronic combined systolic (congestive) and diastolic (congestive) heart failure: Secondary | ICD-10-CM | POA: Diagnosis not present

## 2021-12-19 DIAGNOSIS — J441 Chronic obstructive pulmonary disease with (acute) exacerbation: Secondary | ICD-10-CM | POA: Diagnosis present

## 2021-12-19 DIAGNOSIS — N1831 Chronic kidney disease, stage 3a: Secondary | ICD-10-CM | POA: Diagnosis not present

## 2021-12-19 DIAGNOSIS — Z880 Allergy status to penicillin: Secondary | ICD-10-CM | POA: Diagnosis not present

## 2021-12-19 DIAGNOSIS — Z7951 Long term (current) use of inhaled steroids: Secondary | ICD-10-CM | POA: Diagnosis not present

## 2021-12-19 DIAGNOSIS — E039 Hypothyroidism, unspecified: Secondary | ICD-10-CM | POA: Diagnosis not present

## 2021-12-19 DIAGNOSIS — R0902 Hypoxemia: Secondary | ICD-10-CM | POA: Diagnosis not present

## 2021-12-19 DIAGNOSIS — E782 Mixed hyperlipidemia: Secondary | ICD-10-CM | POA: Diagnosis not present

## 2021-12-19 DIAGNOSIS — Z7982 Long term (current) use of aspirin: Secondary | ICD-10-CM

## 2021-12-19 DIAGNOSIS — I130 Hypertensive heart and chronic kidney disease with heart failure and stage 1 through stage 4 chronic kidney disease, or unspecified chronic kidney disease: Principal | ICD-10-CM | POA: Diagnosis present

## 2021-12-19 DIAGNOSIS — I4510 Unspecified right bundle-branch block: Secondary | ICD-10-CM | POA: Diagnosis not present

## 2021-12-19 DIAGNOSIS — Z20822 Contact with and (suspected) exposure to covid-19: Secondary | ICD-10-CM | POA: Diagnosis not present

## 2021-12-19 DIAGNOSIS — J810 Acute pulmonary edema: Secondary | ICD-10-CM | POA: Diagnosis not present

## 2021-12-19 DIAGNOSIS — E785 Hyperlipidemia, unspecified: Secondary | ICD-10-CM | POA: Diagnosis not present

## 2021-12-19 DIAGNOSIS — Z7984 Long term (current) use of oral hypoglycemic drugs: Secondary | ICD-10-CM | POA: Diagnosis not present

## 2021-12-19 DIAGNOSIS — R0602 Shortness of breath: Secondary | ICD-10-CM | POA: Diagnosis not present

## 2021-12-19 DIAGNOSIS — J80 Acute respiratory distress syndrome: Secondary | ICD-10-CM | POA: Diagnosis not present

## 2021-12-19 DIAGNOSIS — E119 Type 2 diabetes mellitus without complications: Secondary | ICD-10-CM

## 2021-12-19 DIAGNOSIS — Z79899 Other long term (current) drug therapy: Secondary | ICD-10-CM

## 2021-12-19 DIAGNOSIS — Z743 Need for continuous supervision: Secondary | ICD-10-CM | POA: Diagnosis not present

## 2021-12-19 DIAGNOSIS — I493 Ventricular premature depolarization: Secondary | ICD-10-CM | POA: Diagnosis not present

## 2021-12-19 DIAGNOSIS — R0600 Dyspnea, unspecified: Secondary | ICD-10-CM | POA: Diagnosis not present

## 2021-12-19 DIAGNOSIS — I5030 Unspecified diastolic (congestive) heart failure: Secondary | ICD-10-CM | POA: Diagnosis not present

## 2021-12-19 LAB — HEMOGLOBIN A1C
Hgb A1c MFr Bld: 6 % — ABNORMAL HIGH (ref 4.8–5.6)
Mean Plasma Glucose: 125.5 mg/dL

## 2021-12-19 LAB — CBC WITH DIFFERENTIAL/PLATELET
Abs Immature Granulocytes: 0.03 10*3/uL (ref 0.00–0.07)
Basophils Absolute: 0.1 10*3/uL (ref 0.0–0.1)
Basophils Relative: 1 %
Eosinophils Absolute: 0 10*3/uL (ref 0.0–0.5)
Eosinophils Relative: 0 %
HCT: 47.2 % (ref 39.0–52.0)
Hemoglobin: 14.9 g/dL (ref 13.0–17.0)
Immature Granulocytes: 0 %
Lymphocytes Relative: 35 %
Lymphs Abs: 3 10*3/uL (ref 0.7–4.0)
MCH: 27.5 pg (ref 26.0–34.0)
MCHC: 31.6 g/dL (ref 30.0–36.0)
MCV: 87.2 fL (ref 80.0–100.0)
Monocytes Absolute: 0.7 10*3/uL (ref 0.1–1.0)
Monocytes Relative: 8 %
Neutro Abs: 4.9 10*3/uL (ref 1.7–7.7)
Neutrophils Relative %: 56 %
Platelets: 309 10*3/uL (ref 150–400)
RBC: 5.41 MIL/uL (ref 4.22–5.81)
RDW: 17.1 % — ABNORMAL HIGH (ref 11.5–15.5)
WBC: 8.7 10*3/uL (ref 4.0–10.5)
nRBC: 0 % (ref 0.0–0.2)

## 2021-12-19 LAB — I-STAT VENOUS BLOOD GAS, ED
Acid-Base Excess: 0 mmol/L (ref 0.0–2.0)
Acid-base deficit: 2 mmol/L (ref 0.0–2.0)
Bicarbonate: 30.1 mmol/L — ABNORMAL HIGH (ref 20.0–28.0)
Bicarbonate: 27.9 mmol/L (ref 20.0–28.0)
Calcium, Ion: 1.21 mmol/L (ref 1.15–1.40)
Calcium, Ion: 1.13 mmol/L — ABNORMAL LOW (ref 1.15–1.40)
HCT: 48 % (ref 39.0–52.0)
HCT: 49 % (ref 39.0–52.0)
Hemoglobin: 16.3 g/dL (ref 13.0–17.0)
Hemoglobin: 16.7 g/dL (ref 13.0–17.0)
O2 Saturation: 64 %
O2 Saturation: 61 %
Potassium: 4.6 mmol/L (ref 3.5–5.1)
Potassium: 4.4 mmol/L (ref 3.5–5.1)
Sodium: 144 mmol/L (ref 135–145)
Sodium: 145 mmol/L (ref 135–145)
TCO2: 33 mmol/L — ABNORMAL HIGH (ref 22–32)
TCO2: 30 mmol/L (ref 22–32)
pCO2, Ven: 87.7 mmHg (ref 44–60)
pCO2, Ven: 55.6 mmHg (ref 44–60)
pH, Ven: 7.144 — CL (ref 7.25–7.43)
pH, Ven: 7.309 (ref 7.25–7.43)
pO2, Ven: 45 mmHg (ref 32–45)
pO2, Ven: 36 mmHg (ref 32–45)

## 2021-12-19 LAB — ECHOCARDIOGRAM COMPLETE
Area-P 1/2: 2.93 cm2
Calc EF: 36 %
Height: 68 in
S' Lateral: 4.3 cm
Single Plane A2C EF: 33.8 %
Single Plane A4C EF: 37.6 %
Weight: 2758.4 oz

## 2021-12-19 LAB — COMPREHENSIVE METABOLIC PANEL
ALT: 30 U/L (ref 0–44)
AST: 50 U/L — ABNORMAL HIGH (ref 15–41)
Albumin: 3.2 g/dL — ABNORMAL LOW (ref 3.5–5.0)
Alkaline Phosphatase: 103 U/L (ref 38–126)
Anion gap: 6 (ref 5–15)
BUN: 9 mg/dL (ref 8–23)
CO2: 28 mmol/L (ref 22–32)
Calcium: 8.5 mg/dL — ABNORMAL LOW (ref 8.9–10.3)
Chloride: 108 mmol/L (ref 98–111)
Creatinine, Ser: 1.58 mg/dL — ABNORMAL HIGH (ref 0.61–1.24)
GFR, Estimated: 43 mL/min — ABNORMAL LOW (ref >60)
Glucose, Bld: 230 mg/dL — ABNORMAL HIGH (ref 70–99)
Potassium: 4.3 mmol/L (ref 3.5–5.1)
Sodium: 142 mmol/L (ref 135–145)
Total Bilirubin: 0.8 mg/dL (ref 0.3–1.2)
Total Protein: 6.8 g/dL (ref 6.5–8.1)

## 2021-12-19 LAB — TROPONIN I (HIGH SENSITIVITY)
Troponin I (High Sensitivity): 26 ng/L — ABNORMAL HIGH (ref <18)
Troponin I (High Sensitivity): 32 ng/L — ABNORMAL HIGH (ref <18)
Troponin I (High Sensitivity): 28 ng/L — ABNORMAL HIGH (ref <18)

## 2021-12-19 LAB — CBG MONITORING, ED
Glucose-Capillary: 150 mg/dL — ABNORMAL HIGH (ref 70–99)
Glucose-Capillary: 149 mg/dL — ABNORMAL HIGH (ref 70–99)
Glucose-Capillary: 206 mg/dL — ABNORMAL HIGH (ref 70–99)
Glucose-Capillary: 202 mg/dL — ABNORMAL HIGH (ref 70–99)

## 2021-12-19 LAB — BLOOD GAS, VENOUS
Acid-base deficit: 0.3 mmol/L (ref 0.0–2.0)
Bicarbonate: 24.1 mmol/L (ref 20.0–28.0)
Drawn by: 32061
O2 Saturation: 100 %
Patient temperature: 37
pCO2, Ven: 38 mmHg — ABNORMAL LOW (ref 44–60)
pH, Ven: 7.41 (ref 7.25–7.43)
pO2, Ven: 148 mmHg — ABNORMAL HIGH (ref 32–45)

## 2021-12-19 LAB — BRAIN NATRIURETIC PEPTIDE: B Natriuretic Peptide: 2203.7 pg/mL — ABNORMAL HIGH (ref 0.0–100.0)

## 2021-12-19 LAB — PHOSPHORUS: Phosphorus: 5 mg/dL — ABNORMAL HIGH (ref 2.5–4.6)

## 2021-12-19 LAB — GLUCOSE, CAPILLARY: Glucose-Capillary: 167 mg/dL — ABNORMAL HIGH (ref 70–99)

## 2021-12-19 LAB — SARS CORONAVIRUS 2 BY RT PCR: SARS Coronavirus 2 by RT PCR: NEGATIVE

## 2021-12-19 LAB — MAGNESIUM: Magnesium: 2.7 mg/dL — ABNORMAL HIGH (ref 1.7–2.4)

## 2021-12-19 LAB — TSH: TSH: 4.919 u[IU]/mL — ABNORMAL HIGH (ref 0.350–4.500)

## 2021-12-19 LAB — PROCALCITONIN: Procalcitonin: <0.1 ng/mL

## 2021-12-19 MED ORDER — INSULIN ASPART 100 UNIT/ML IJ SOLN
0.0000 [IU] | Freq: Three times a day (TID) | INTRAMUSCULAR | Status: DC
  Administered 2021-12-19: 5 [IU] via SUBCUTANEOUS
  Administered 2021-12-20: 2 [IU] via SUBCUTANEOUS
  Administered 2021-12-20 (×2): 3 [IU] via SUBCUTANEOUS
  Administered 2021-12-21 (×2): 2 [IU] via SUBCUTANEOUS

## 2021-12-19 MED ORDER — IPRATROPIUM-ALBUTEROL 0.5-2.5 (3) MG/3ML IN SOLN
3.0000 mL | Freq: Four times a day (QID) | RESPIRATORY_TRACT | Status: DC
  Administered 2021-12-19 – 2021-12-20 (×5): 3 mL via RESPIRATORY_TRACT
  Filled 2021-12-19 (×5): qty 3

## 2021-12-19 MED ORDER — METHYLPREDNISOLONE SODIUM SUCC 125 MG IJ SOLR
80.0000 mg | Freq: Two times a day (BID) | INTRAMUSCULAR | Status: DC
  Administered 2021-12-19 – 2021-12-20 (×3): 80 mg via INTRAVENOUS
  Filled 2021-12-19 (×3): qty 2

## 2021-12-19 MED ORDER — POTASSIUM CHLORIDE CRYS ER 20 MEQ PO TBCR
20.0000 meq | EXTENDED_RELEASE_TABLET | Freq: Every morning | ORAL | Status: DC
  Administered 2021-12-19 – 2021-12-21 (×3): 20 meq via ORAL
  Filled 2021-12-19 (×3): qty 1

## 2021-12-19 MED ORDER — ASPIRIN 81 MG PO TBEC
81.0000 mg | DELAYED_RELEASE_TABLET | Freq: Every day | ORAL | Status: DC
  Administered 2021-12-19 – 2021-12-21 (×3): 81 mg via ORAL
  Filled 2021-12-19 (×3): qty 1

## 2021-12-19 MED ORDER — LEVOTHYROXINE SODIUM 50 MCG PO TABS
50.0000 ug | ORAL_TABLET | Freq: Every morning | ORAL | Status: DC
  Administered 2021-12-20 – 2021-12-21 (×2): 50 ug via ORAL
  Filled 2021-12-19: qty 1
  Filled 2021-12-19: qty 2
  Filled 2021-12-19: qty 1

## 2021-12-19 MED ORDER — SIMVASTATIN 20 MG PO TABS
10.0000 mg | ORAL_TABLET | Freq: Every day | ORAL | Status: DC
  Administered 2021-12-19 – 2021-12-20 (×2): 10 mg via ORAL
  Filled 2021-12-19 (×2): qty 1

## 2021-12-19 MED ORDER — SACUBITRIL-VALSARTAN 49-51 MG PO TABS
1.0000 | ORAL_TABLET | Freq: Two times a day (BID) | ORAL | Status: DC
  Administered 2021-12-19 – 2021-12-21 (×4): 1 via ORAL
  Filled 2021-12-19 (×4): qty 1

## 2021-12-19 MED ORDER — FUROSEMIDE 10 MG/ML IJ SOLN
40.0000 mg | Freq: Two times a day (BID) | INTRAMUSCULAR | Status: DC
  Administered 2021-12-19 – 2021-12-20 (×4): 40 mg via INTRAVENOUS
  Filled 2021-12-19 (×4): qty 4

## 2021-12-19 MED ORDER — ACETAMINOPHEN 650 MG RE SUPP: 650.0000 mg | Freq: Four times a day (QID) | RECTAL | Status: DC | PRN

## 2021-12-19 MED ORDER — PERFLUTREN LIPID MICROSPHERE
1.0000 mL | INTRAVENOUS | Status: AC | PRN
  Administered 2021-12-19: 4 mL via INTRAVENOUS

## 2021-12-19 MED ORDER — ENALAPRILAT 1.25 MG/ML IV SOLN
1.2500 mg | Freq: Once | INTRAVENOUS | Status: AC
  Administered 2021-12-19: 1.25 mg via INTRAVENOUS
  Filled 2021-12-19: qty 1

## 2021-12-19 MED ORDER — BUDESONIDE 0.25 MG/2ML IN SUSP
0.2500 mg | Freq: Two times a day (BID) | RESPIRATORY_TRACT | Status: DC
  Administered 2021-12-19 – 2021-12-21 (×5): 0.25 mg via RESPIRATORY_TRACT
  Filled 2021-12-19 (×5): qty 2

## 2021-12-19 MED ORDER — ARFORMOTEROL TARTRATE 15 MCG/2ML IN NEBU
15.0000 ug | INHALATION_SOLUTION | Freq: Two times a day (BID) | RESPIRATORY_TRACT | Status: DC
  Administered 2021-12-19 – 2021-12-21 (×5): 15 ug via RESPIRATORY_TRACT
  Filled 2021-12-19 (×5): qty 2

## 2021-12-19 MED ORDER — ALBUTEROL SULFATE (2.5 MG/3ML) 0.083% IN NEBU: 2.5000 mg | INHALATION_SOLUTION | RESPIRATORY_TRACT | Status: DC | PRN

## 2021-12-19 MED ORDER — ACETAMINOPHEN 325 MG PO TABS: 650.0000 mg | ORAL_TABLET | Freq: Four times a day (QID) | ORAL | Status: DC | PRN

## 2021-12-19 MED ORDER — SODIUM CHLORIDE 0.9 % IV SOLN
100.0000 mg | Freq: Two times a day (BID) | INTRAVENOUS | Status: DC
  Administered 2021-12-19 – 2021-12-21 (×5): 100 mg via INTRAVENOUS
  Filled 2021-12-19 (×6): qty 100

## 2021-12-19 MED ORDER — AMIODARONE HCL 200 MG PO TABS
100.0000 mg | ORAL_TABLET | Freq: Every day | ORAL | Status: DC
  Administered 2021-12-19 – 2021-12-21 (×3): 100 mg via ORAL
  Filled 2021-12-19 (×3): qty 1

## 2021-12-19 MED ORDER — FUROSEMIDE 10 MG/ML IJ SOLN
40.0000 mg | Freq: Once | INTRAMUSCULAR | Status: AC
Start: 2021-12-19 — End: 2021-12-19
  Administered 2021-12-19: 40 mg via INTRAVENOUS
  Filled 2021-12-19: qty 4

## 2021-12-19 MED ORDER — NITROGLYCERIN IN D5W 200-5 MCG/ML-% IV SOLN
0.0000 ug/min | INTRAVENOUS | Status: DC
  Administered 2021-12-19: 5 ug/min via INTRAVENOUS
  Administered 2021-12-19: 65 ug/min via INTRAVENOUS
  Filled 2021-12-19 (×2): qty 250

## 2021-12-19 MED ORDER — NITROGLYCERIN 0.4 MG SL SUBL: 0.4000 mg | SUBLINGUAL_TABLET | SUBLINGUAL | Status: DC | PRN

## 2021-12-19 NOTE — Progress Notes (Signed)
RT assessed BiPAP mask. Mask good seal, Leak @ 15. HHN given through BiPAP and Pt's VSS.

## 2021-12-19 NOTE — Progress Notes (Signed)
Patient currently on room air with vitals stable. BIPAP not indicated at this time, RT will continue to monitor.

## 2021-12-19 NOTE — ED Notes (Signed)
Bipap alarm going off again. I readjusted and notified RT to have them come to assess the size and make sure seal is good

## 2021-12-19 NOTE — ED Notes (Signed)
Oral care provided 

## 2021-12-19 NOTE — Consult Note (Addendum)
Cardiology Consultation:   Patient ID: Gary Morse MRN: [id number]; DOB: Sep 21, 1934  Admit date: 12/19/2021 Date of Consult: 12/19/2021  PCP:  [name], MD   [facility] Providers Cardiologist:  Sinclair Grooms, MD   Patient Profile:   Gary Morse is a 87 y.o. 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 Tana Coast.  History of Present Illness:   Gary Morse typically follows with Dr. Tamala Julian in clinic.  He has a history of chronic systolic and diastolic heart failure with an LVEF as low as 15 to 20% in 2019.  Unclear if cardiomyopathy due to PVCs versus hypertension.  Heart monitor showed a PVC burden of 18% in 2019.  He saw EP and was started on amiodarone for PVC burden.  Follow-up echocardiogram in 2019 showed improvement in LVEF to 40%.  ICD was deferred.  GDMT includes 10 mg Jardiance, 49-51 mg Entresto twice daily, carvedilol 6.25 mg daily.  He is not on an MRA.  He takes 40 mg Lasix daily.  He was recently seen by Dr. Tamala Julian in clinic on 10/06/2021 and was doing well at that time.  Dr. Tamala Julian opted to reduce amiodarone to 100 mg daily.  He presented to [facility] ED 12/19/2021 with respiratory distress requiring epinephrine and Solu-Medrol.  He had sudden onset shortness of breath prompting evaluation.  The patient reports he had a similar episode about a week ago that improved spontaneously.  This episode woke him from sleep.  He did not tolerate albuterol with EMS.  He was admitted to medicine service and cardiology consulted for possible COPD exacerbation versus CHF exacerbation.  BNP 2204 He has received 40 mg IV Lasix x 2 doses HS trop 26 --> 32 sCr 1.58 Mg 2.7 Procalcitonin < 0.10 TSH 4.919  CXR consistent with mild cardiogenic failure EKG  with sinus rhythm with long PR, RBBB, HR 78  He was placed on BiPAP and nitro gtt for hypertension.   During my interview, he reports 2 weeks ago he had 2 drinks of "white liquor" and then ate a banana. He states it is well known that he should not have eaten a banana in the setting of white liquor. This corresponded with an episode of shortness of breath that resolved without ER evaluation. His son who generally cares for him is out of town. Yesterday he had 2 servings of "neck bone" which is high in sodium. He woke from sleep and was hot. He got up to turn down the temperature and became short of breath. He was unable to catch his breath and asked his son to call EMS. After 2 doses of lasix, he states he feels completely back to normal and ready for discharge.   He is still on a nitro gtt. He reports his pressure has been labile and was hypertensive last month. He saw his PCP who made a medication change.     Past Medical History:  Diagnosis Date   Adenomatous polyp    POSITIVE, COLON 7/18, [place] COLON NEGATIVE   Allergic rhinitis    BPH (benign prostatic hyperplasia)    MICROSCOPIC HEMATURIA WORKUP IN 2004 WITH UROLOGY NEGATIVE, DR. Karsten Ro   CHF (congestive heart failure) ([place])    EPISODE IN 1994 SECONDARY TO HYPERTENSION, ECHO 2005 NORMAL LV FUNCTION   CKD (  chronic kidney disease)    STAGE 2 MICROALBUMIN POSITIVE   Colon polyp    COLON IN 2018   COPD (chronic obstructive pulmonary disease) ([place])    ON X-RAY, CIGAR USE--PFT IN 2013 NORMAL   Diabetes type 2, controlled ([place])    Dyslipidemia    Dyspnea    ED (erectile dysfunction)    Hypertension    Patellar bursitis of right knee    PRE-PATELLA BURSITIS WITH INTERMITTENT SWELLING     Past Surgical History:  Procedure Laterality Date   COLONOSCOPY  12/2010   10/2016   INGUINAL HERNIA REPAIR Right    1976     Home Medications:  Prior to Admission medications   Medication Sig Start Date End Date Taking? Authorizing  Provider  amiodarone (PACERONE) 100 MG tablet Take 1 tablet (100 mg total) by mouth daily. 10/06/21   Belva Crome, MD  aspirin EC 81 MG tablet Take 81 mg by mouth daily.    [provider]  budesonide-formoterol [facility]) 80-4.5 MCG/ACT inhaler SMARTSIG:By Mouth 05/09/20   [provider]  carvedilol (COREG) 6.25 MG tablet Take 1 tablet (6.25 mg total) by mouth 2 (two) times daily with a meal. 10/18/20   Belva Crome, MD  FARXIGA 10 MG TABS tablet Take 10 mg by mouth daily. 05/02/20   [provider]  fluticasone (FLONASE) 50 MCG/ACT nasal spray Place 1 spray into both nostrils daily as needed for allergies.     [provider]  furosemide (LASIX) 40 MG tablet TAKE ONE TABLET BY MOUTH TWICE DAILY take an additional t if you gain 2 lbs or more 05/14/21   Belva Crome, MD  glimepiride (AMARYL) 1 MG tablet Take 1 mg by mouth daily with breakfast.    [provider]  JARDIANCE 10 MG TABS tablet Take 10 mg by mouth every morning. 09/15/21   [provider]  levothyroxine (SYNTHROID) 50 MCG tablet Take 50 mcg by mouth every morning. 05/12/19   [provider]  potassium chloride SA (KLOR-CON M) 20 MEQ tablet TAKE ONE TABLET BY MOUTH EVERY MORNING 11/21/21   Belva Crome, MD  sacubitril-valsartan (ENTRESTO) 49-51 MG Take 1 tablet by mouth 2 (two) times daily. 10/16/21   Belva Crome, MD  simvastatin (ZOCOR) 10 MG tablet Take 10 mg by mouth at bedtime. 03/14/20   [provider]    Inpatient Medications: Scheduled Meds:  amiodarone  100 mg Oral Daily   arformoterol  15 mcg Nebulization BID   aspirin EC  81 mg Oral Daily   budesonide (PULMICORT) nebulizer solution  0.25 mg Nebulization BID   furosemide  40 mg Intravenous BID   insulin aspart  0-15 Units Subcutaneous TID WC   ipratropium-albuterol  3 mL Nebulization Q6H   levothyroxine  50 mcg Oral q morning   methylPREDNISolone (SOLU-MEDROL) injection  80 mg Intravenous BID    potassium chloride SA  20 mEq Oral q morning   sacubitril-valsartan  1 tablet Oral BID   simvastatin  10 mg Oral QHS   Continuous Infusions:  doxycycline (VIBRAMYCIN) IV Stopped (12/19/21 1149)   nitroGLYCERIN 50 mcg/min (12/19/21 1353)   PRN Meds: acetaminophen **OR** acetaminophen, albuterol, nitroGLYCERIN  Allergies:    Allergies  Allergen Reactions   Metformin And Related Other (See Comments)    Unknown reaction   Penicillins Hives    Welps developed. Has patient had a PCN reaction causing immediate rash, facial/tongue/throat swelling, SOB or lightheadedness with hypotension: No Has patient  had a PCN reaction causing severe rash involving mucus membranes or skin necrosis: No Has patient had a PCN reaction that required hospitalization: No Has patient had a PCN reaction occurring within the last 10 years: No If all of the above answers are "NO", then may proceed with Cephalosporin use.       Social History:   Social History   Socioeconomic History   Marital status: Married    Spouse name: Not on file   Number of children: 7   Years of education: Not on file   Highest education level: Not on file  Occupational History   Occupation: CARPENTER  Tobacco Use   Smoking status: Every Day    Types: Cigars   Smokeless tobacco: Never  Vaping Use   Vaping Use: Never used  Substance and Sexual Activity   Alcohol use: Yes    Comment: OCCASIONAL   Drug use: Never   Sexual activity: Not on file  Other Topics Concern   Not on file  Social History Narrative   Not on file   Social Determinants of Health   Financial Resource Strain: Not on file  Food Insecurity: Not on file  Transportation Needs: Not on file  Physical Activity: Not on file  Stress: Not on file  Social Connections: Not on file  Intimate Partner Violence: Not on file    Family History:    Family History  Problem Relation Age of Onset   Congestive Heart Failure Mother 42   Heart attack Father 41    CAD Father    Heart failure Sister    Congestive Heart Failure Sister    Throat cancer Brother    Kidney failure Brother    Other Brother        END STAGE RENAL DISEASE   Colon cancer Neg Hx    Colon polyps Neg Hx    Liver disease Neg Hx      ROS:  Please see the history of present illness.   All other ROS reviewed and negative.     Physical Exam/Data:   Vitals:   12/19/21 1237 12/19/21 1238 12/19/21 1256 12/19/21 1345  BP:   (!) 170/88 (!) 159/75  Pulse: (!) 58 (!) 55 (!) 58 (!) 58  Resp: (!) 21 (!) '24 18 18  '$ Temp:      SpO2: 99% 98% 98% 97%  Weight:      Height:        Intake/Output Summary (Last 24 hours) at 12/19/2021 1355 Last data filed at 12/19/2021 1052 Gross per 24 hour  Intake 4.72 ml  Output 950 ml  Net -945.28 ml      12/19/2021    2:39 AM 10/06/2021    9:10 AM 05/10/2020    3:35 PM  Last 3 Weights  Weight (lbs) 172 lb 6.4 oz 172 lb 6.4 oz 180 lb  Weight (kg) 78.2 kg 78.2 kg 81.647 kg     Body mass index is 26.21 kg/m.  General:  Well nourished, well developed, in no acute distress HEENT: normal Neck: no JVD Vascular: No carotid bruits; Distal pulses 2+ bilaterally Cardiac:  normal S1, S2; RRR; no murmur  Lungs:  crackles in left base Abd: soft, nontender, no hepatomegaly  Ext: no edema Musculoskeletal:  No deformities, BUE and BLE strength normal and equal Skin: warm and dry  Neuro:  CNs 2-12 intact, no focal abnormalities noted Psych:  Normal affect   EKG:  The EKG was personally reviewed and demonstrates:  sinus  rhythm HR 78, long PR, RBBB Telemetry:  Telemetry was personally reviewed and demonstrates:  sinus with HR 70s and PVCs  Relevant CV Studies:  Echo pending  Echo 2021: 1. Left ventricular ejection fraction, by estimation, is 35 to 40%. The  left ventricle has moderately decreased function. The left ventricle  demonstrates global hypokinesis. There is moderate asymmetric left  ventricular hypertrophy. Left ventricular   diastolic parameters are consistent with Grade I diastolic dysfunction  (impaired relaxation).   2. Right ventricular systolic function is normal. The right ventricular  size is normal. Tricuspid regurgitation signal is inadequate for assessing  PA pressure.   3. Left atrial size was mildly dilated.   4. The mitral valve is normal in structure. No evidence of mitral valve  regurgitation.   5. The aortic valve is normal in structure. Aortic valve regurgitation is  trivial. No aortic stenosis is present.   6. Aortic dilatation noted. There is mild dilatation of the ascending  aorta measuring 38 mm.   7. The inferior vena cava is normal in size with greater than 50%  respiratory variability, suggesting right atrial pressure of 3 mmHg.   8. Compared to prior TTE on 05/29/52, LV systolic function has improved   Laboratory Data:  High Sensitivity Troponin:   Recent Labs  Lab 12/19/21 0248 12/19/21 0836  TROPONINIHS 26* 32*     Chemistry Recent Labs  Lab 12/19/21 0248 12/19/21 0258 12/19/21 0821  NA 142 144 145  K 4.3 4.6 4.4  CL 108  --   --   CO2 28  --   --   GLUCOSE 230*  --   --   BUN 9  --   --   CREATININE 1.58*  --   --   CALCIUM 8.5*  --   --   MG 2.7*  --   --   GFRNONAA 43*  --   --   ANIONGAP 6  --   --     Recent Labs  Lab 12/19/21 0248  PROT 6.8  ALBUMIN 3.2*  AST 50*  ALT 30  ALKPHOS 103  BILITOT 0.8   Lipids No results for input(s): "CHOL", "TRIG", "HDL", "LABVLDL", "LDLCALC", "CHOLHDL" in the last 168 hours.  Hematology Recent Labs  Lab 12/19/21 0248 12/19/21 0258 12/19/21 0821  WBC 8.7  --   --   RBC 5.41  --   --   HGB 14.9 16.3 16.7  HCT 47.2 48.0 49.0  MCV 87.2  --   --   MCH 27.5  --   --   MCHC 31.6  --   --   RDW 17.1*  --   --   PLT 309  --   --    Thyroid  Recent Labs  Lab 12/19/21 0248  TSH 4.919*    BNP Recent Labs  Lab 12/19/21 0248  BNP 2,203.7*    DDimer No results for input(s): "DDIMER" in the last 168  hours.   Radiology/Studies:  DG Chest Port 1 View  Result Date: 12/19/2021 CLINICAL DATA:  Dyspnea EXAM: PORTABLE CHEST 1 VIEW COMPARISON:  The 05/02/2020 FINDINGS: The lungs are symmetrically well expanded. There is mild diffuse interstitial pulmonary infiltrate most in keeping with mild cardiogenic failure. Mild cardiomegaly is present. No pneumothorax or pleural effusion. No acute bone abnormality. IMPRESSION: Mild cardiogenic failure. Electronically Signed   By: Fidela Salisbury M.D.   On: 12/19/2021 03:06     Assessment and Plan:   Acute on chronic  systolic and diastolic heart failure Respiratory failure Hypertension Given CXR and BNP, suspect presentation is consistent with CHF exacerbation, unable to exclude COPD exacerbation.  Given dietary indiscretion and timing of resolution of respiratory failure, suspect flash pulmonary edema.  GDMT: coreg, entresto 4-51 mg BID, 10 mg jardiance, and lasix 40 mg daily - 40 mg IV lasix x 2 doses - would give an additional dose of IV lasix today given crackles on exam - will need to wean off nitro gtt, adjust BP medications as needed - may require addition of imdur if BP not controlled off of nitro   PVCs - heart monitor with 18% PVC burden, has done well on amiodarone - amiodarone was reduced from 200 mg to 100 mg daily 09/2021 - question if dose reduction may have coincided with an increase in PVC burden - he is not bothered by palpitations - continue amiodarone 100 mg for now   Risk Assessment/Risk Scores:      New York Heart Association (NYHA) Functional Class NYHA Class II       For questions or updates, please contact [facility] Please consult [email or web address] for contact info under    Signed, Ledora Bottcher, PA  12/19/2021 1:55 PM  Agree with note by Fabian Sharp PA-C  87 year old African-American male patient of Dr. Tamala Julian with a history of nonischemic cardiomyopathy on guideline directed optimal medical therapy with  most recent EF by 2D echo of 40%, up from 15 to 20%.  He apparently recently had "white liquor", a banana and pork.  He developed cute onset of shortness of breath.  He was seen in the ER and flash pulmonary edema and placed on BiPAP.  He has diuresed approximate liter and feels clinically improved currently.  Currently off oxygen.  He has crackles on exam at the bases.  Checks x-ray was consistent with CHF as was his BNP of 2200.  Troponin's were low and flat.  EKG showed right bundle branch block which is old.  Does have a history of high PVC burden thought to be contributory to his cardiomyopathy improved on amiodarone.  Agree with admission for continued diuresis.  Counseled about medication and dietary compliance.  Probably home in the next 24 to 48 hours.  Lorretta Harp, M.D., [place], [facility], [place] [address]. [place], [place]  [id number]  [phone] 12/19/2021 2:30 PM

## 2021-12-19 NOTE — ED Notes (Signed)
Ed provider at bedside at this time ?

## 2021-12-19 NOTE — ED Provider Notes (Signed)
[facility] EMERGENCY DEPARTMENT Provider Note   CSN: [id number] Arrival date & time: 12/19/21  0230     History  Chief Complaint  Patient presents with   Respiratory Distress    Gary Morse is a 87 y.o. male.  87 yo M with a cc of sob.  Started suddenly about 2 hours ago.  Had something similar about a week ago and improved spontaneously.  He denies any chest pain.  Denies cough or fever.  Was well prior to this.  Woke him up from sleep.  Was treated by EMS for possible asthma exacerbation but did not tolerate albuterol.  He received magnesium and Solu-Medrol.        Home Medications Prior to Admission medications   Medication Sig Start Date End Date Taking? Authorizing Provider  amiodarone (PACERONE) 100 MG tablet Take 1 tablet (100 mg total) by mouth daily. 10/06/21   Belva Crome, MD  aspirin EC 81 MG tablet Take 81 mg by mouth daily.    [provider]  budesonide-formoterol [facility]) 80-4.5 MCG/ACT inhaler SMARTSIG:By Mouth 05/09/20   [provider]  carvedilol (COREG) 6.25 MG tablet Take 1 tablet (6.25 mg total) by mouth 2 (two) times daily with a meal. 10/18/20   Belva Crome, MD  FARXIGA 10 MG TABS tablet Take 10 mg by mouth daily. 05/02/20   [provider]  fluticasone (FLONASE) 50 MCG/ACT nasal spray Place 1 spray into both nostrils daily as needed for allergies.     [provider]  furosemide (LASIX) 40 MG tablet TAKE ONE TABLET BY MOUTH TWICE DAILY take an additional t if you gain 2 lbs or more 05/14/21   Belva Crome, MD  glimepiride (AMARYL) 1 MG tablet Take 1 mg by mouth daily with breakfast.    [provider]  JARDIANCE 10 MG TABS tablet Take 10 mg by mouth every morning. 09/15/21   [provider]  levothyroxine (SYNTHROID) 50 MCG tablet Take 50 mcg by mouth every morning. 05/12/19   [provider]  potassium chloride SA (KLOR-CON M) 20 MEQ tablet TAKE ONE TABLET BY MOUTH  EVERY MORNING 11/21/21   Belva Crome, MD  sacubitril-valsartan (ENTRESTO) 49-51 MG Take 1 tablet by mouth 2 (two) times daily. 10/16/21   Belva Crome, MD  simvastatin (ZOCOR) 10 MG tablet Take 10 mg by mouth at bedtime. 03/14/20   [provider]      Allergies    Metformin and related and Penicillins    Review of Systems   Review of Systems  Physical Exam Updated Vital Signs BP (!) 189/118   Pulse 74   Resp (!) 27   Ht '5\' 8"'$  (1.727 m)   Wt 78.2 kg   SpO2 100%   BMI 26.21 kg/m  Physical Exam Vitals and nursing note reviewed.  Constitutional:      Appearance: He is well-developed. He is diaphoretic.  HENT:     Head: Normocephalic and atraumatic.  Eyes:     Pupils: Pupils are equal, round, and reactive to light.  Neck:     Vascular: No JVD.  Cardiovascular:     Rate and Rhythm: Normal rate and regular rhythm.     Heart sounds: No murmur heard.    No friction rub. No gallop.  Pulmonary:     Effort: No respiratory distress.     Breath sounds: No wheezing.  Abdominal:     General: There is no distension.  Tenderness: There is no abdominal tenderness. There is no guarding or rebound.  Musculoskeletal:        General: Normal range of motion.     Cervical back: Normal range of motion and neck supple.  Skin:    Coloration: Skin is not pale.     Findings: No rash.  Neurological:     Mental Status: He is alert and oriented to person, place, and time.  Psychiatric:        Behavior: Behavior normal.     ED Results / Procedures / Treatments   Labs (all labs ordered are listed, but only abnormal results are displayed) Labs Reviewed  CBC WITH DIFFERENTIAL/PLATELET - Abnormal; Notable for the following components:      Result Value   RDW 17.1 (*)    All other components within normal limits  COMPREHENSIVE METABOLIC PANEL - Abnormal; Notable for the following components:   Glucose, Bld 230 (*)    Creatinine, Ser 1.58 (*)    Calcium 8.5 (*)    Albumin  3.2 (*)    AST 50 (*)    GFR, Estimated 43 (*)    All other components within normal limits  BRAIN NATRIURETIC PEPTIDE - Abnormal; Notable for the following components:   B Natriuretic Peptide 2,203.7 (*)    All other components within normal limits  I-STAT VENOUS BLOOD GAS, ED - Abnormal; Notable for the following components:   pH, Ven 7.144 (*)    pCO2, Ven 87.7 (*)    Bicarbonate 30.1 (*)    TCO2 33 (*)    All other components within normal limits  TROPONIN I (HIGH SENSITIVITY) - Abnormal; Notable for the following components:   Troponin I (High Sensitivity) 26 (*)    All other components within normal limits    EKG EKG Interpretation  Date/Time:  Friday December 19 2021 02:38:45 EDT Ventricular Rate:  78 PR Interval:  271 QRS Duration: 179 QT Interval:  475 QTC Calculation: 542 R Axis:   155 Text Interpretation: Sinus or ectopic atrial rhythm Ventricular premature complex Prolonged PR interval Right bundle branch block Baseline wander in lead(s) I II aVL V4 V5 V6 No significant change since last tracing Confirmed by Deno Etienne [phone]) on 12/19/2021 2:54:04 AM  Radiology DG Chest Port 1 View  Result Date: 12/19/2021 CLINICAL DATA:  Dyspnea EXAM: PORTABLE CHEST 1 VIEW COMPARISON:  The 05/02/2020 FINDINGS: The lungs are symmetrically well expanded. There is mild diffuse interstitial pulmonary infiltrate most in keeping with mild cardiogenic failure. Mild cardiomegaly is present. No pneumothorax or pleural effusion. No acute bone abnormality. IMPRESSION: Mild cardiogenic failure. Electronically Signed   By: Fidela Salisbury M.D.   On: 12/19/2021 03:06    Procedures Procedures    Medications Ordered in ED Medications  nitroGLYCERIN (NITROSTAT) SL tablet 0.4 mg (has no administration in time range)  nitroGLYCERIN 50 mg in dextrose 5 % 250 mL (0.2 mg/mL) infusion (10 mcg/min Intravenous Infusion Verify 12/19/21 0501)  acetaminophen (TYLENOL) tablet 650 mg (has no administration in  time range)    Or  acetaminophen (TYLENOL) suppository 650 mg (has no administration in time range)  enalaprilat (VASOTEC) injection 1.25 mg (1.25 mg Intravenous Given 12/19/21 0448)  furosemide (LASIX) injection 40 mg (40 mg Intravenous Given 12/19/21 0457)    ED Course/ Medical Decision Making/ A&P                           Medical Decision Making Amount  and/or Complexity of Data Reviewed Labs: ordered. Radiology: ordered.  Risk Prescription drug management. Decision regarding hospitalization.   87 yo M with a chief complaints of difficulty breathing.  This started suddenly couple hours ago.  On my exam most consistent with acute pulmonary edema.  Patient significantly hypertensive with rales JVD.  Will give afterload reducing agents.  Start on BiPAP.  Blood work chest x-ray reassess.   Chest x-ray with pulmonary edema on my independent interpretation.  VBG with hypercarbia.  No leukocytosis troponin mildly elevated.  BNP 2200.  Patient was reassessed on BiPAP and blood pressures come down.  He is feeling a bit better.  We will give a bolus dose of Lasix.  Will discuss with medicine for admission.  CRITICAL CARE Performed by: Cecilio Asper   Total critical care time: 35 minutes  Critical care time was exclusive of separately billable procedures and treating other patients.  Critical care was necessary to treat or prevent imminent or life-threatening deterioration.  Critical care was time spent personally by me on the following activities: development of treatment plan with patient and/or surrogate as well as nursing, discussions with consultants, evaluation of patient's response to treatment, examination of patient, obtaining history from patient or surrogate, ordering and performing treatments and interventions, ordering and review of laboratory studies, ordering and review of radiographic studies, pulse oximetry and re-evaluation of patient's condition.   The patients  results and plan were reviewed and discussed.   Any x-rays performed were independently reviewed by myself.   Differential diagnosis were considered with the presenting HPI.  Medications  nitroGLYCERIN (NITROSTAT) SL tablet 0.4 mg (has no administration in time range)  nitroGLYCERIN 50 mg in dextrose 5 % 250 mL (0.2 mg/mL) infusion (10 mcg/min Intravenous Infusion Verify 12/19/21 0501)  acetaminophen (TYLENOL) tablet 650 mg (has no administration in time range)    Or  acetaminophen (TYLENOL) suppository 650 mg (has no administration in time range)  enalaprilat (VASOTEC) injection 1.25 mg (1.25 mg Intravenous Given 12/19/21 0448)  furosemide (LASIX) injection 40 mg (40 mg Intravenous Given 12/19/21 0457)    Vitals:   12/19/21 0415 12/19/21 0430 12/19/21 0445 12/19/21 0455  BP: (!) 174/96 (!) 136/120 (!) 186/128 (!) 189/118  Pulse: (!) 48  74 74  Resp: (!) 22 (!) 24 (!) 21 (!) 27  SpO2: 99%  100% 100%  Weight:      Height:        Final diagnoses:  Acute pulmonary edema (HCC)    Admission/ observation were discussed with the admitting physician, patient and/or family and they are comfortable with the plan.          Final Clinical Impression(s) / ED Diagnoses Final diagnoses:  Acute pulmonary edema [facility])    Rx / DC Orders ED Discharge Orders     None         Deno Etienne, DO 12/19/21 0602

## 2021-12-19 NOTE — ED Notes (Signed)
Per provider pt can have something to drink

## 2021-12-19 NOTE — H&P (Signed)
History and Physical    PLEASE NOTE THAT DRAGON DICTATION SOFTWARE WAS USED IN THE CONSTRUCTION OF THIS NOTE.   Gary Morse ESP:[id number] DOB: 09-Aug-1934 DOA: 12/19/2021  PCP: [name], MD  Patient coming from: home   I have personally briefly reviewed patient's old medical records in [place]  Chief Complaint: Shortness of breath  HPI: Gary Morse is a 87 y.o. male with medical history significant for chronic systolic/diastolic heart failure, COPD, type 2 diabetes mellitus, hypertension, hyperlipidemia acquired hypothyroidism, stage IIIa CKD associated baseline creatinine range 1.3-1.6, who is admitted to [facility] on 12/19/2021 with acute hypoxic hypercapnic respiratory failure after presenting from home to [facility] ED complaining of shortness of breath.   The patient reports 1 day of progressive shortness of breath associate with orthopnea and worsening of edema in the bilateral lower extremities.  He also notes new onset nonproductive cough in the absence of hemoptysis.  Also associated with wheezing.  Denies any associated chest pain, diaphoresis, palpitations, nausea, vomiting, dizziness, presyncope, syncope.  No subjective fever, chills, rigors, or generalized myalgias.  No new lower extremity erythema or calf tenderness.  No known baseline supplemental oxygen requirements.  Medical history notable for chronic systolic/diastolic heart failure, with most recent echocardiogram in March 2021 notable for LVEF 35 to 63%, grade 1 diastolic dysfunction, normal right ventricular systolic function, mildly dilated left atrium, trivial aortic regurgitation.  He also has a documented history of frequent PVCs for which she is on amiodarone at home.   He also has a documented history of COPD.  The patient has stopped smoking cigarettes, he does continue to smoke intermittent cigars.  In the setting of progression of his shortness of breath, EMS was contacted, and noted the  patient's initial O2 sats to be in the high 80s on room air, patient was placed on CPAP and brought to [facility] emergency department for further evaluation and management thereof.      ED Course:  Upon arrival at [facility], ED, patient was started on BiPAP. I-STAT VBG 7.14/87 at the onset of BiPAP use.   Vital signs in the ED were notable for the following: Afebrile; heart rate 72-76; initial blood pressure 175/126, subsequent improving to 174/96 following initiation of BiPAP and nitroglycerin drip, as further detailed below; respiratory rate 22-27, oxygen saturation 99 to 100% on most recent BiPAP settings, which are 18/8 with 60% FiO2.  Labs were notable for the following: CMP notable for the following: Potassium 4.3, creatinine 1.58.  BNP 2200 compared to most recent prior value of 3 25 May 2020.  High-sensitivity troponin I noted to be 26, relative to most recent value of 21 in January 2022.  CBC notable for white blood cell count 8700 with 35% neutrophils.  Imaging and additional notable ED work-up: EKG shows sinus rhythm with single PVC, prolonged PR, rebound branch block, heart rate 78, nonspecific T wave inversion in V1, and no evidence of ST changes, including no evidence of ST elevation.  Chest x-ray shows mild diffuse interstitial edema, while showing no evidence of pleural effusion or pneumothorax.  While in the ED, the following were administered: Nitroglycerin drip.  Vasotec 1.25 mg IV x1, Lasix 40 mg IV x1 dose around 5 AM.  Subsequently, the patient was admitted for further evaluation and management of acute hypoxic hypercapnic respiratory failure, suspected to be on the basis of acute on chronic systolic/diastolic heart failure, with potential secondary contribution from acute COPD exacerbation.    Review of Systems:  As per HPI otherwise 10 point review of systems negative.   Past Medical History:  Diagnosis Date   Adenomatous polyp    POSITIVE, COLON 7/18, [place]  COLON NEGATIVE   Allergic rhinitis    BPH (benign prostatic hyperplasia)    MICROSCOPIC HEMATURIA WORKUP IN 2004 WITH UROLOGY NEGATIVE, DR. Karsten Ro   CHF (congestive heart failure) ([place])    EPISODE IN 1994 SECONDARY TO HYPERTENSION, ECHO 2005 NORMAL LV FUNCTION   CKD (chronic kidney disease)    STAGE 2 MICROALBUMIN POSITIVE   Colon polyp    COLON IN 2018   COPD (chronic obstructive pulmonary disease) ([place])    ON X-RAY, CIGAR USE--PFT IN 2013 NORMAL   Diabetes type 2, controlled ([place])    Dyslipidemia    Dyspnea    ED (erectile dysfunction)    Hypertension    Patellar bursitis of right knee    PRE-PATELLA BURSITIS WITH INTERMITTENT SWELLING     Past Surgical History:  Procedure Laterality Date   COLONOSCOPY  12/2010   10/2016   INGUINAL HERNIA REPAIR Right    1976    Social History:  reports that he has been smoking cigars. He has never used smokeless tobacco. He reports current alcohol use. He reports that he does not use drugs.   Allergies  Allergen Reactions   Metformin And Related Other (See Comments)    Unknown reaction   Penicillins Other (See Comments)    Welps developed. Has patient had a PCN reaction causing immediate rash, facial/tongue/throat swelling, SOB or lightheadedness with hypotension: No Has patient had a PCN reaction causing severe rash involving mucus membranes or skin necrosis: No Has patient had a PCN reaction that required hospitalization: No Has patient had a PCN reaction occurring within the last 10 years: No If all of the above answers are "NO", then may proceed with Cephalosporin use.       Family History  Problem Relation Age of Onset   Congestive Heart Failure Mother 22   Heart attack Father 61   CAD Father    Heart failure Sister    Congestive Heart Failure Sister    Throat cancer Brother    Kidney failure Brother    Other Brother        END STAGE RENAL DISEASE   Colon cancer Neg Hx    Colon polyps Neg Hx    Liver disease Neg  Hx     Family history reviewed and not pertinent    Prior to Admission medications   Medication Sig Start Date End Date Taking? Authorizing Provider  amiodarone (PACERONE) 100 MG tablet Take 1 tablet (100 mg total) by mouth daily. 10/06/21   Belva Crome, MD  aspirin EC 81 MG tablet Take 81 mg by mouth daily.    [provider]  budesonide-formoterol [facility]) 80-4.5 MCG/ACT inhaler SMARTSIG:By Mouth 05/09/20   [provider]  carvedilol (COREG) 6.25 MG tablet Take 1 tablet (6.25 mg total) by mouth 2 (two) times daily with a meal. 10/18/20   Belva Crome, MD  FARXIGA 10 MG TABS tablet Take 10 mg by mouth daily. 05/02/20   [provider]  fluticasone (FLONASE) 50 MCG/ACT nasal spray Place 1 spray into both nostrils daily as needed for allergies.     [provider]  furosemide (LASIX) 40 MG tablet TAKE ONE TABLET BY MOUTH TWICE DAILY take an additional t if you gain 2 lbs or more 05/14/21   Belva Crome, MD  glimepiride [facility])  1 MG tablet Take 1 mg by mouth daily with breakfast.    [provider]  JARDIANCE 10 MG TABS tablet Take 10 mg by mouth every morning. 09/15/21   [provider]  levothyroxine (SYNTHROID) 50 MCG tablet Take 50 mcg by mouth every morning. 05/12/19   [provider]  potassium chloride SA (KLOR-CON M) 20 MEQ tablet TAKE ONE TABLET BY MOUTH EVERY MORNING 11/21/21   Belva Crome, MD  sacubitril-valsartan (ENTRESTO) 49-51 MG Take 1 tablet by mouth 2 (two) times daily. 10/16/21   Belva Crome, MD  simvastatin (ZOCOR) 10 MG tablet Take 10 mg by mouth at bedtime. 03/14/20   [provider]     Objective    Physical Exam: Vitals:   12/19/21 0455 12/19/21 0500 12/19/21 0515 12/19/21 0530  BP: (!) 189/118 (!) 194/105 (!) 189/99 (!) 195/113  Pulse: 74 75 72 75  Resp: (!) 27 (!) 28 (!) 27 (!) 28  SpO2: 100% 100% 100% 96%  Weight:      Height:        General: appears to be stated age; alert;  mildly increased work of breathing noted Skin: warm, dry, no rash Head:  AT/[place] Mouth:  Oral mucosa membranes appear moist, normal dentition Neck: supple; trachea midline Heart:  RRR; did not appreciate any M/R/G Lungs: Bilateral expiratory wheezes noted, otherwise CTAB, did not appreciate any rales, or rhonchi Abdomen: + BS; soft, ND, NT Vascular: 2+ pedal pulses b/l; 2+ radial pulses b/l Extremities: Trace edema bilateral lower extremities , no muscle wasting Neuro: strength and sensation intact in upper and lower extremities b/l    Labs on Admission: I have personally reviewed following labs and imaging studies  CBC: Recent Labs  Lab 12/19/21 0248 12/19/21 0258  WBC 8.7  --   NEUTROABS 4.9  --   HGB 14.9 16.3  HCT 47.2 48.0  MCV 87.2  --   PLT 309  --    Basic Metabolic Panel: Recent Labs  Lab 12/19/21 0248 12/19/21 0258  NA 142 144  K 4.3 4.6  CL 108  --   CO2 28  --   GLUCOSE 230*  --   BUN 9  --   CREATININE 1.58*  --   CALCIUM 8.5*  --    GFR: Estimated Creatinine Clearance: 33.7 mL/min (A) (by C-G formula based on SCr of 1.58 mg/dL (H)). Liver Function Tests: Recent Labs  Lab 12/19/21 0248  AST 50*  ALT 30  ALKPHOS 103  BILITOT 0.8  PROT 6.8  ALBUMIN 3.2*   No results for input(s): "LIPASE", "AMYLASE" in the last 168 hours. No results for input(s): "AMMONIA" in the last 168 hours. Coagulation Profile: No results for input(s): "INR", "PROTIME" in the last 168 hours. Cardiac Enzymes: No results for input(s): "CKTOTAL", "CKMB", "CKMBINDEX", "TROPONINI" in the last 168 hours. BNP (last 3 results) No results for input(s): "PROBNP" in the last 8760 hours. HbA1C: No results for input(s): "HGBA1C" in the last 72 hours. CBG: No results for input(s): "GLUCAP" in the last 168 hours. Lipid Profile: No results for input(s): "CHOL", "HDL", "LDLCALC", "TRIG", "CHOLHDL", "LDLDIRECT" in the last 72 hours. Thyroid Function Tests: No results for input(s):  "TSH", "T4TOTAL", "FREET4", "T3FREE", "THYROIDAB" in the last 72 hours. Anemia Panel: No results for input(s): "VITAMINB12", "FOLATE", "FERRITIN", "TIBC", "IRON", "RETICCTPCT" in the last 72 hours. Urine analysis: No results found for: "COLORURINE", "APPEARANCEUR", "LABSPEC", "PHURINE", "GLUCOSEU", "HGBUR", "BILIRUBINUR", "KETONESUR", "PROTEINUR", "UROBILINOGEN", "NITRITE", "LEUKOCYTESUR"  Radiological Exams on  Admission: DG Chest Port 1 View  Result Date: 12/19/2021 CLINICAL DATA:  Dyspnea EXAM: PORTABLE CHEST 1 VIEW COMPARISON:  The 05/02/2020 FINDINGS: The lungs are symmetrically well expanded. There is mild diffuse interstitial pulmonary infiltrate most in keeping with mild cardiogenic failure. Mild cardiomegaly is present. No pneumothorax or pleural effusion. No acute bone abnormality. IMPRESSION: Mild cardiogenic failure. Electronically Signed   By: Fidela Salisbury M.D.   On: 12/19/2021 03:06     EKG: Independently reviewed, with result as described above.    Assessment/Plan   Principal Problem:   Acute respiratory failure with hypoxia and hypercapnia (HCC) Active Problems:   Acute on chronic combined systolic and diastolic CHF (congestive heart failure) (HCC)   COPD with acute exacerbation (HCC)   Stage 3a chronic kidney disease (CKD) (HCC)   DM2 (diabetes mellitus, type 2) (HCC)   HLD (hyperlipidemia)   Acquired hypothyroidism      #) Acute hypoxic hypercapnic respiratory failure: Hypoxic at home, in the absence of any known underlying baseline supplemental oxygen requirements, with initial VBG suggestive of respiratory acidemia, although it is notable that this was per i-STAT VBG.  Suspect these findings are multifactorial in nature, with suspected primary contribution from acute on chronic systolic/diastolic heart failure in setting of shortness of breath orthopnea, worsening peripheral edema elevated BNP and evidence of interstitial edema on chest x-ray, with potential  secondary contribution from acute COPD exacerbation.  ACS less likely in the absence of any associated chest pain, with only very mild elevation in troponin relative to most recent prior in January 2022, which is suspected to be consistent with supply/demand mismatch as a result of acutely decompensated heart failure along with diminished oxygen delivery capacity due to ensuing acute hypoxia.  Clinically, acute pulmonary embolism appears less likely at this time.  Additionally, underlying infectious process appears less likely, although will check procalcitonin as well as COVID PCR to further assess.  Chest x-ray shows no evidence of pneumothorax.  Plan: Continue BiPAP, repeat ABG ordered for 8 AM.  Monitor continuous pulse oximetry.  Continue nitroglycerin drip.  Lasix 40 mg IV twice daily.  Procalcitonin level.  Check COVID-19 PCR.  Repeat echocardiogram in the morning as its been 18 months since most recent prior echo.  Urinary drug screen.  Add on serum magnesium and phosphorus levels.  Trend troponin.       #) Acute on chronic systolic/diastolic heart failure: Per [facility] recent echo in March 2011, LVEF 35% as well as grade 1 diastolic dysfunction.  Suspected acute exacerbation [place] above.  This is relative to outpatient diuretic regimen which consists of Lasix 40 mg p.o. twice daily.  Appears to be benefiting from BiPAP, which was initiated in the ED.  Unclear mechanism leading to exacerbation at this time.  Will strive for afterload/preload optimization, with continuation of nitroglycerin drip as well as as additional IV diuresis.  Plan: Monitor strict I's and O's and daily weights.  Monitor on symmetry.  Lasix 40 mg IV twice daily, as above.  Continue nitroglycerin drip.  Add on serum magnesium level.  Resume home daily potassium chloride 20 mill equivalents repeat echocardiogram in the morning.  Resume home Entresto.  Given prolonged PR interval identified on presenting EKG, will hold home  Coreg for now, particularly in the setting of acutely decompensated heart failure.       #) Acute COPD exacerbation: Suspected to represent secondary pathophysiology stemming from inciting acute on chronic systolic/diastolic heart failure, as above.   Plan: Solu-Medrol, scheduled  duo nebulizer treatments, as needed albuterol.  Start doxycycline for benefit of reduction in duration of hospitalization in setting of acute COPD exacerbation requiring hospitalization.  Repeat blood gas, as above.  Continue BiPAP for now.  Holding home Symbicort for now.       #) Type 2 diabetes mellitus: Documented history of such, on dapagliflozin, glimepiride.  Presenting blood sugar noted to be 230.  Plan: Holding home oral hypoglycemic agents for now.  Add on A1c level.  CBG monitoring on a before every meal/at bedtime basis with moderate size scale insulin.      #) Hyperlipidemia: On simvastatin outpatient.  Plan: Continue on statin.        #) Acquired hypothyroidism: Documented history of such, on Synthroid as an outpatient.  Plan: Continue home Synthroid.  Add on TSH level.      #) Stage IIIa CKD: Recommend history of such, associated baseline creatinine range 1.3-1.6, presenting creatinine consistent with this range.  will closely monitor ensuing considering renal trend, particularly in response to plan for IV diuresis.  Plan: Monitor strict I's and O's and daily weights.  Avoid nephrotoxic agents.  Repeat CMP in the morning.  Add on serum magnesium level.           DVT prophylaxis: SCD's   Code Status: Full code Family Communication: none Disposition Plan: Per Rounding Team Consults called: none;  Admission status: inpatient;     PLEASE NOTE THAT DRAGON DICTATION SOFTWARE WAS USED IN THE CONSTRUCTION OF THIS NOTE.   Clara DO [facility]  From [place]   12/19/2021, 6:35 AM

## 2021-12-19 NOTE — ED Triage Notes (Signed)
BIB [facility] with c/o respiratory distress 1-2 hours PTA. 0.15 epi, 2 g mag, 125 solumedrol, unable to tolerate albuterol given by EMS. Arrives on 15L NRB.

## 2021-12-19 NOTE — Progress Notes (Signed)
[facility]                                                                              Gary Morse, is a 87 y.o. male, DOB - 1934-10-20, ZJI:[id number] Admit date - 12/19/2021    Outpatient Primary MD for the patient is [name], MD  LOS - 0  days  Chief Complaint  Patient presents with   Respiratory Distress       Brief summary   Patient is a 87 year old male with chronic systolic/diastolic CHF, COPD, diabetes mellitus type 2, HTN, HLP, hypothyroidism, stage IIIa CKD baseline creatinine 1.3-1.6 presented with shortness of breath.  Patient reported 1 day of progressive shortness of breath associated with orthopnea, worsening lower extremity edema.  He also noted new onset nonproductive cough, no hemoptysis associated with wheezing.  No fevers or chills or generalized myalgias.  Not on O2 at baseline. Patient was brought to ED via EMS, initial sats in 80s on room air. VBG pH 7.14, PCO2 87, patient was placed on BiPAP Additionally, placed on nitroglycerin drip, received Vasotec 1.25 mg IV x1, Lasix 40 mg IV x1 Admitted for further work-up.   Assessment & Plan    Principal Problem:   Acute respiratory failure with hypoxia and hypercapnia (HCC), multifactorial Underlying history of COPD with acute exacerbation, acute on chronic CHF  -On exam this a.m., still on BiPAP, repeat ABG - will continue nitroglycerin drip, placed on Lasix 40 mg IV twice daily, cardiology consulted -Continue IV Solu-Medrol, scheduled nebs, doxycycline, BiPAP, added Brovana, Pulmicort -Wean O2 as tolerated   Active Problems:   Acute on chronic combined systolic and diastolic CHF (congestive heart failure) (HCC) -Previous echo 06/2019 had shown EF of 35 to 40% with global hypokinesis, G1 DD -Continue IV Lasix 40 mg every 12 hours, strict I's and O's and daily weights -On BiPAP, on nitroglycerin drip cardiology consulted, repeat 2D echo    COPD with acute exacerbation  ([place]) -Counseled on nicotine cessation, -Continue IV Solu-Medrol, scheduled nebs, doxycycline, added Brovana, Pulmicort -Flutter valve when off BiPAP    Stage 3a chronic kidney disease (CKD) (HCC) -Baseline creatinine 1.3-1.6 -Follow closely with IV diuresis     DM2 (diabetes mellitus, type 2) ([place]), NIDDM, with CKD Recent Labs    12/19/21 0744 12/19/21 1125  GLUCAP 150* 149*   -Follow hemoglobin A1c, continue moderate sliding scale insulin while inpatient    HLD (hyperlipidemia) -Continue simvastatin    Acquired hypothyroidism Continue Synthroid   Code Status: Full code DVT Prophylaxis:  SCDs Start: 12/19/21 0550   Level of Care: Level of care: Progressive Family Communication: Updated patient's son and friend at the bedside   Disposition Plan:      Remains inpatient appropriate: Still on BiPAP on my exam, will likely need 48 to 72 hours   Procedures:  None  Consultants:   Cardiology  Antimicrobials:   Anti-infectives (From admission, onward)    Start     Dose/Rate Route Frequency Ordered Stop   12/19/21 0800  doxycycline (VIBRAMYCIN) 100 mg in sodium chloride 0.9 % 250 mL IVPB       Note to Pharmacy: (In  setting of acute copd exac)   100 mg 125 mL/hr over 120 Minutes Intravenous 2 times daily 12/19/21 0630            Medications  amiodarone  100 mg Oral Daily   aspirin EC  81 mg Oral Daily   furosemide  40 mg Intravenous BID   insulin aspart  0-15 Units Subcutaneous TID WC   ipratropium-albuterol  3 mL Nebulization Q6H   levothyroxine  50 mcg Oral q morning   methylPREDNISolone (SOLU-MEDROL) injection  80 mg Intravenous BID   potassium chloride SA  20 mEq Oral q morning   sacubitril-valsartan  1 tablet Oral BID   simvastatin  10 mg Oral QHS      Subjective:   Hassan Blackshire was seen and examined today.  Still on BiPAP on exam, wheezing, no chest pain, family at the bedside.  No fevers. Objective:   Vitals:   12/19/21 1200 12/19/21 1220  12/19/21 1237 12/19/21 1238  BP: (!) 170/100 (!) 166/71    Pulse: (!) 53 (!) 55 (!) 58 (!) 55  Resp: (!) 22 (!) 21 (!) 21 (!) 24  Temp:      SpO2: 94% 94% 99% 98%  Weight:      Height:        Intake/Output Summary (Last 24 hours) at 12/19/2021 1257 Last data filed at 12/19/2021 1052 Gross per 24 hour  Intake 4.72 ml  Output 950 ml  Net -945.28 ml     Wt Readings from Last 3 Encounters:  12/19/21 78.2 kg  10/06/21 78.2 kg  05/10/20 81.6 kg     Exam General: Alert and oriented, on BiPAP Cardiovascular: S1 S2 auscultated,  RRR Respiratory: Diffuse scattered wheezing Gastrointestinal: Soft, nontender, nondistended, + bowel sounds Ext: trace pedal edema bilaterally Neuro: no new deficits Psych: Normal affect and demeanor, alert and oriented x3     Data Reviewed:  I have personally reviewed following labs    CBC Lab Results  Component Value Date   WBC 8.7 12/19/2021   RBC 5.41 12/19/2021   HGB 16.7 12/19/2021   HCT 49.0 12/19/2021   MCV 87.2 12/19/2021   MCH 27.5 12/19/2021   PLT 309 12/19/2021   MCHC 31.6 12/19/2021   RDW 17.1 (H) 12/19/2021   LYMPHSABS 3.0 12/19/2021   MONOABS 0.7 12/19/2021   EOSABS 0.0 12/19/2021   BASOSABS 0.1 85/46/2703     Last metabolic panel Lab Results  Component Value Date   NA 145 12/19/2021   K 4.4 12/19/2021   CL 108 12/19/2021   CO2 28 12/19/2021   BUN 9 12/19/2021   CREATININE 1.58 (H) 12/19/2021   GLUCOSE 230 (H) 12/19/2021   GFRNONAA 43 (L) 12/19/2021   GFRAA 58 (L) 03/22/2019   CALCIUM 8.5 (L) 12/19/2021   PHOS 5.0 (H) 12/19/2021   PROT 6.8 12/19/2021   ALBUMIN 3.2 (L) 12/19/2021   LABGLOB 3.5 03/22/2019   AGRATIO 1.1 (L) 03/22/2019   BILITOT 0.8 12/19/2021   ALKPHOS 103 12/19/2021   AST 50 (H) 12/19/2021   ALT 30 12/19/2021   ANIONGAP 6 12/19/2021    CBG (last 3)  Recent Labs    12/19/21 0744 12/19/21 1125  GLUCAP 150* 149*      Coagulation Profile: No results for input(s): "INR", "PROTIME" in  the last 168 hours.   Radiology Studies: I have personally reviewed the imaging studies  DG Chest Port 1 View  Result Date: 12/19/2021 CLINICAL DATA:  Dyspnea IMPRESSION: Mild cardiogenic failure. Electronically  Signed   By: Fidela Salisbury M.D.   On: 12/19/2021 03:06       Gracieann Stannard M.D. [facility] 12/19/2021, 12:57 PM  Available via Epic secure chat 7am-7pm After 7 pm, please refer to night coverage provider listed on amion.

## 2021-12-19 NOTE — ED Notes (Signed)
Called food and nutrition again for meal tray.  First call they were going to send tray pt never received so they are sending a new tray now

## 2021-12-19 NOTE — ED Notes (Signed)
RT consulted pt is off Bipap at this time

## 2021-12-19 NOTE — ED Notes (Signed)
Went in to introduce myself and Bipap was alarming.  Bipap adjusted and tightened in all areas.  Bipap alarm is off seal is good

## 2021-12-19 NOTE — ED Notes (Signed)
Ordered fresh meal tray

## 2021-12-20 DIAGNOSIS — I5043 Acute on chronic combined systolic (congestive) and diastolic (congestive) heart failure: Secondary | ICD-10-CM | POA: Diagnosis not present

## 2021-12-20 DIAGNOSIS — I493 Ventricular premature depolarization: Secondary | ICD-10-CM

## 2021-12-20 DIAGNOSIS — E039 Hypothyroidism, unspecified: Secondary | ICD-10-CM | POA: Diagnosis not present

## 2021-12-20 DIAGNOSIS — J810 Acute pulmonary edema: Secondary | ICD-10-CM | POA: Diagnosis not present

## 2021-12-20 DIAGNOSIS — J9601 Acute respiratory failure with hypoxia: Secondary | ICD-10-CM | POA: Diagnosis not present

## 2021-12-20 LAB — BASIC METABOLIC PANEL
Anion gap: 10 (ref 5–15)
BUN: 22 mg/dL (ref 8–23)
CO2: 22 mmol/L (ref 22–32)
Calcium: 8.1 mg/dL — ABNORMAL LOW (ref 8.9–10.3)
Chloride: 105 mmol/L (ref 98–111)
Creatinine, Ser: 1.71 mg/dL — ABNORMAL HIGH (ref 0.61–1.24)
GFR, Estimated: 39 mL/min — ABNORMAL LOW (ref >60)
Glucose, Bld: 167 mg/dL — ABNORMAL HIGH (ref 70–99)
Potassium: 3.7 mmol/L (ref 3.5–5.1)
Sodium: 137 mmol/L (ref 135–145)

## 2021-12-20 LAB — GLUCOSE, CAPILLARY
Glucose-Capillary: 164 mg/dL — ABNORMAL HIGH (ref 70–99)
Glucose-Capillary: 181 mg/dL — ABNORMAL HIGH (ref 70–99)
Glucose-Capillary: 159 mg/dL — ABNORMAL HIGH (ref 70–99)
Glucose-Capillary: 174 mg/dL — ABNORMAL HIGH (ref 70–99)

## 2021-12-20 MED ORDER — CARVEDILOL 25 MG PO TABS
25.0000 mg | ORAL_TABLET | Freq: Two times a day (BID) | ORAL | Status: DC
  Administered 2021-12-21: 25 mg via ORAL
  Filled 2021-12-20: qty 1

## 2021-12-20 MED ORDER — CARVEDILOL 12.5 MG PO TABS
12.5000 mg | ORAL_TABLET | Freq: Two times a day (BID) | ORAL | Status: DC
  Administered 2021-12-20: 12.5 mg via ORAL
  Filled 2021-12-20: qty 1

## 2021-12-20 MED ORDER — METHYLPREDNISOLONE SODIUM SUCC 125 MG IJ SOLR
80.0000 mg | Freq: Every day | INTRAMUSCULAR | Status: DC
  Administered 2021-12-21: 80 mg via INTRAVENOUS
  Filled 2021-12-20: qty 2

## 2021-12-20 MED ORDER — SPIRONOLACTONE 12.5 MG HALF TABLET
12.5000 mg | ORAL_TABLET | Freq: Every day | ORAL | Status: DC
  Administered 2021-12-20 – 2021-12-21 (×2): 12.5 mg via ORAL
  Filled 2021-12-20 (×2): qty 1

## 2021-12-20 MED ORDER — METHYLPREDNISOLONE SODIUM SUCC 40 MG IJ SOLR: 40.0000 mg | Freq: Two times a day (BID) | INTRAMUSCULAR | Status: DC

## 2021-12-20 NOTE — Progress Notes (Signed)
Call not return Text page Dr. Cyd Silence again

## 2021-12-20 NOTE — Progress Notes (Signed)
Bipap not indicated at this time, pt on room air with stable vitals.

## 2021-12-20 NOTE — Progress Notes (Signed)
Patient has brady down in the 40's S.B. while sleeping.  When awake 50-70 S.B. -S.R. Dr Cyd Silence was made aware of this early tonight.

## 2021-12-20 NOTE — Progress Notes (Signed)
[facility]                                                                              Gary Morse, is a 87 y.o. male, DOB - December 10, 1934, NTI:[id number] Admit date - 12/19/2021    Outpatient Primary MD for the patient is [name], MD  LOS - 1  days  Chief Complaint  Patient presents with   Respiratory Distress       Brief summary   Patient is a 87 year old male with chronic systolic/diastolic CHF, COPD, diabetes mellitus type 2, HTN, HLP, hypothyroidism, stage IIIa CKD baseline creatinine 1.3-1.6 presented with shortness of breath.  Patient reported 1 day of progressive shortness of breath associated with orthopnea, worsening lower extremity edema.  He also noted new onset nonproductive cough, no hemoptysis associated with wheezing.  No fevers or chills or generalized myalgias.  Not on O2 at baseline. Patient was brought to ED via EMS, initial sats in 80s on room air. VBG pH 7.14, PCO2 87, patient was placed on BiPAP Additionally, placed on nitroglycerin drip, received Vasotec 1.25 mg IV x1, Lasix 40 mg IV x1 Admitted for further work-up.   Assessment & Plan    Principal Problem:   Acute respiratory failure with hypoxia and hypercapnia (HCC), multifactorial Underlying history of COPD with acute exacerbation, acute on chronic CHF  -Improving, BiPAP off, now on room air -Continue IV Lasix for diuresis, negative balance of 2.4 L -Cardiology following, uptitrated Coreg, Entresto, Jardiance -Creatinine trended up to 1.7 today, monitor closely   Active Problems:   Acute on chronic combined systolic and diastolic CHF (congestive heart failure) (HCC) with flash pulmonary edema -2D echo showed EF of 35 to 40%, G1 DD global hypokinesis with severe hypokinesis of the inferior lateral wall, normal right ventricular systolic function -Continue IV Lasix for diuresis -Strict I's and O's and daily weights    COPD with acute exacerbation (HCC) -Counseled on  nicotine cessation -Taper Solu-Medrol to 40 mg twice daily -Continue scheduled nebs, doxycycline, Brovana, Pulmicort  -Continue flutter valve    Stage 3a chronic kidney disease (CKD) (HCC) -Baseline creatinine 1.3-1.6 -Creatinine trended up to 1.7 today, follow closely with diuresis     DM2 (diabetes mellitus, type 2) ([place]), NIDDM, with CKD Recent Labs    12/19/21 1125 12/19/21 1636 12/19/21 1744 12/19/21 2126 12/20/21 0617 12/20/21 1049  GLUCAP 149* 206* 202* 167* 164* 181*   -A1c 6.0, CBGs elevated likely due to steroids, tapering IV Solu-Medrol today Continue sliding scale insulin while inpatient    HLD (hyperlipidemia) -Continue simvastatin    Acquired hypothyroidism Continue Synthroid   Code Status: Full code DVT Prophylaxis:  SCDs Start: 12/19/21 0550   Level of Care: Level of care: Progressive Family Communication: Updated patient's wife and son at the bedside   Disposition Plan:      Remains inpatient appropriate: Hopefully DC home in next 24 to 48 hours if improving  Procedures:  2D echo  Consultants:   Cardiology  Antimicrobials:   Anti-infectives (From admission, onward)    Start     Dose/Rate Route Frequency Ordered Stop   12/19/21 0800  doxycycline (VIBRAMYCIN)  100 mg in sodium chloride 0.9 % 250 mL IVPB       Note to Pharmacy: (In setting of acute copd exac)   100 mg 125 mL/hr over 120 Minutes Intravenous 2 times daily 12/19/21 0630            Medications  amiodarone  100 mg Oral Daily   arformoterol  15 mcg Nebulization BID   aspirin EC  81 mg Oral Daily   budesonide (PULMICORT) nebulizer solution  0.25 mg Nebulization BID   carvedilol  25 mg Oral BID WC   furosemide  40 mg Intravenous BID   insulin aspart  0-15 Units Subcutaneous TID WC   levothyroxine  50 mcg Oral q morning   methylPREDNISolone (SOLU-MEDROL) injection  80 mg Intravenous BID   potassium chloride SA  20 mEq Oral q morning   sacubitril-valsartan  1 tablet Oral BID    simvastatin  10 mg Oral QHS   spironolactone  12.5 mg Oral Daily      Subjective:   Gary Morse was seen and examined today.  Feels a lot better today, mild scattered wheezing improving, off the BiPAP.  Shortness of breath improving, no chest pain, dizziness, nausea vomiting or abdominal pain.  Wife and son at the bedside Objective:   Vitals:   12/20/21 0632 12/20/21 0739 12/20/21 0907 12/20/21 1051  BP: (!) 155/87 (!) 149/73  (!) 142/73  Pulse: (!) 56 66  (!) 59  Resp: '16 17  18  '$ Temp:  (!) 97.4 F (36.3 C)  97.7 F (36.5 C)  TempSrc:  Oral  Oral  SpO2: 98% 99% 100% 98%  Weight:      Height:        Intake/Output Summary (Last 24 hours) at 12/20/2021 1411 Last data filed at 12/20/2021 1130 Gross per 24 hour  Intake 120 ml  Output 1615 ml  Net -1495 ml     Wt Readings from Last 3 Encounters:  12/20/21 77.9 kg  10/06/21 78.2 kg  05/10/20 81.6 kg   Physical Exam General: Alert and oriented x 3, NAD Cardiovascular: S1 S2 clear, RRR.  Respiratory: Diminished breath sound at the bases with mild scattered wheezing, improving from yesterday Gastrointestinal: Soft, nontender, nondistended, NBS Ext: no pedal edema bilaterally Neuro: no new deficits Psych: Normal affect and demeanor, alert and oriented x3    Data Reviewed:  I have personally reviewed following labs    CBC Lab Results  Component Value Date   WBC 8.7 12/19/2021   RBC 5.41 12/19/2021   HGB 16.7 12/19/2021   HCT 49.0 12/19/2021   MCV 87.2 12/19/2021   MCH 27.5 12/19/2021   PLT 309 12/19/2021   MCHC 31.6 12/19/2021   RDW 17.1 (H) 12/19/2021   LYMPHSABS 3.0 12/19/2021   MONOABS 0.7 12/19/2021   EOSABS 0.0 12/19/2021   BASOSABS 0.1 29/56/2130     Last metabolic panel Lab Results  Component Value Date   NA 137 12/20/2021   K 3.7 12/20/2021   CL 105 12/20/2021   CO2 22 12/20/2021   BUN 22 12/20/2021   CREATININE 1.71 (H) 12/20/2021   GLUCOSE 167 (H) 12/20/2021   GFRNONAA 39 (L)  12/20/2021   GFRAA 58 (L) 03/22/2019   CALCIUM 8.1 (L) 12/20/2021   PHOS 5.0 (H) 12/19/2021   PROT 6.8 12/19/2021   ALBUMIN 3.2 (L) 12/19/2021   LABGLOB 3.5 03/22/2019   AGRATIO 1.1 (L) 03/22/2019   BILITOT 0.8 12/19/2021   ALKPHOS 103 12/19/2021   AST 50 (  H) 12/19/2021   ALT 30 12/19/2021   ANIONGAP 10 12/20/2021    CBG (last 3)  Recent Labs    12/19/21 2126 12/20/21 0617 12/20/21 1049  GLUCAP 167* 164* 181*      Coagulation Profile: No results for input(s): "INR", "PROTIME" in the last 168 hours.   Radiology Studies: I have personally reviewed the imaging studies  DG Chest Port 1 View  Result Date: 12/19/2021 CLINICAL DATA:  Dyspnea IMPRESSION: Mild cardiogenic failure. Electronically Signed   By: Fidela Salisbury M.D.   On: 12/19/2021 03:06       Ihor Meinzer M.D. [facility] 12/20/2021, 2:11 PM  Available via Epic secure chat 7am-7pm After 7 pm, please refer to night coverage provider listed on amion.

## 2021-12-20 NOTE — Progress Notes (Signed)
   12/19/21 2357  Vitals  Temp 98.4 F (36.9 C)  Temp Source Oral  BP (!) 149/78  MAP (mmHg) 100  BP Location Right Arm  BP Method Automatic  Patient Position (if appropriate) Lying  Pulse Rate 61  Pulse Rate Source Monitor  ECG Heart Rate 61  Resp 19  Level of Consciousness  Level of Consciousness Alert  MEWS COLOR  MEWS Score Color Green  Oxygen Therapy  SpO2 97 %  O2 Device Nasal Cannula  Pain Assessment  Pain Scale 0-10  Pain Score 0  MEWS Score  MEWS Temp 0  MEWS Systolic 0  MEWS Pulse 0  MEWS RR 0  MEWS LOC 0  MEWS Score 0   Text page Dr. Cyd Silence that patient's B.P. remain high while on NTG drip awaiting return call. Patient voiced no complaints

## 2021-12-20 NOTE — Progress Notes (Signed)
Call return see new orders.

## 2021-12-20 NOTE — Progress Notes (Signed)
Rounding Note    Patient Name: Gary Morse Date of Encounter: 12/20/2021  [place] Cardiologist: Sinclair Grooms, MD   Subjective   Off BIPAP; on RA Bps 140s-170s SBP HR high 50s-60s Net - 1.5L Crt 1.58->1.7   Inpatient Medications    Scheduled Meds:  amiodarone  100 mg Oral Daily   arformoterol  15 mcg Nebulization BID   aspirin EC  81 mg Oral Daily   budesonide (PULMICORT) nebulizer solution  0.25 mg Nebulization BID   carvedilol  12.5 mg Oral BID WC   furosemide  40 mg Intravenous BID   insulin aspart  0-15 Units Subcutaneous TID WC   levothyroxine  50 mcg Oral q morning   methylPREDNISolone (SOLU-MEDROL) injection  80 mg Intravenous BID   potassium chloride SA  20 mEq Oral q morning   sacubitril-valsartan  1 tablet Oral BID   simvastatin  10 mg Oral QHS   Continuous Infusions:  doxycycline (VIBRAMYCIN) IV 100 mg (12/20/21 0844)   nitroGLYCERIN 70 mcg/min (12/20/21 0412)   PRN Meds: acetaminophen **OR** acetaminophen, albuterol, nitroGLYCERIN   Vital Signs    Vitals:   12/20/21 0500 12/20/21 0632 12/20/21 0739 12/20/21 0907  BP: (!) 147/77 (!) 155/87 (!) 149/73   Pulse: (!) 59 (!) 56 66   Resp: '18 16 17   '$ Temp:   (!) 97.4 F (36.3 C)   TempSrc:   Oral   SpO2: 96% 98% 99% 100%  Weight:      Height:        Intake/Output Summary (Last 24 hours) at 12/20/2021 0950 Last data filed at 12/20/2021 0741 Gross per 24 hour  Intake 120 ml  Output 1820 ml  Net -1700 ml      12/20/2021    3:39 AM 12/19/2021    6:13 PM 12/19/2021    2:39 AM  Last 3 Weights  Weight (lbs) 171 lb 11.8 oz 172 lb 13.5 oz 172 lb 6.4 oz  Weight (kg) 77.9 kg 78.4 kg 78.2 kg      Telemetry    Sinus or ectopic, PVCs - Personally Reviewed  ECG    NA - Personally Reviewed  Physical Exam   Vitals:   12/20/21 0739 12/20/21 0907  BP: (!) 149/73   Pulse: 66   Resp: 17   Temp: (!) 97.4 F (36.3 C)   SpO2: 99% 100%    GEN: No acute distress.   Neck: No  JVD Cardiac: RRR, no murmurs, rubs, or gallops.  Respiratory: Clear to auscultation bilaterally. GI: Soft, nontender, non-distended  MS: No edema; No deformity. Neuro:  Nonfocal  Psych: Normal affect   Labs    High Sensitivity Troponin:   Recent Labs  Lab 12/19/21 0248 12/19/21 0836 12/19/21 1317  TROPONINIHS 26* 32* 28*     Chemistry Recent Labs  Lab 12/19/21 0248 12/19/21 0258 12/19/21 0821 12/20/21 0328  NA 142 144 145 137  K 4.3 4.6 4.4 3.7  CL 108  --   --  105  CO2 28  --   --  22  GLUCOSE 230*  --   --  167*  BUN 9  --   --  22  CREATININE 1.58*  --   --  1.71*  CALCIUM 8.5*  --   --  8.1*  MG 2.7*  --   --   --   PROT 6.8  --   --   --   ALBUMIN 3.2*  --   --   --  AST 50*  --   --   --   ALT 30  --   --   --   ALKPHOS 103  --   --   --   BILITOT 0.8  --   --   --   GFRNONAA 43*  --   --  39*  ANIONGAP 6  --   --  10    Lipids No results for input(s): "CHOL", "TRIG", "HDL", "LABVLDL", "LDLCALC", "CHOLHDL" in the last 168 hours.  Hematology Recent Labs  Lab 12/19/21 0248 12/19/21 0258 12/19/21 0821  WBC 8.7  --   --   RBC 5.41  --   --   HGB 14.9 16.3 16.7  HCT 47.2 48.0 49.0  MCV 87.2  --   --   MCH 27.5  --   --   MCHC 31.6  --   --   RDW 17.1*  --   --   PLT 309  --   --    Thyroid  Recent Labs  Lab 12/19/21 0248  TSH 4.919*    BNP Recent Labs  Lab 12/19/21 0248  BNP 2,203.7*    DDimer No results for input(s): "DDIMER" in the last 168 hours.   Radiology    ECHOCARDIOGRAM COMPLETE  Result Date: 12/19/2021    ECHOCARDIOGRAM REPORT   Patient Name:   Gary Morse Date of Exam: 12/19/2021 Medical Rec #:  [id number]       Height:       68.0 in Accession #:    [id number]      Weight:       172.4 lb Date of Birth:  [birth year]       BSA:          1.919 m Patient Age:    87 years        BP:           110/84 mmHg Patient Gender: M               HR:           56 bpm. Exam Location:  Inpatient Procedure: 2D Echo, Cardiac Doppler, Color  Doppler and Intracardiac            Opacification Agent Indications:    CHF-Acute Systolic Z66.06  History:        Patient has prior history of Echocardiogram examinations, most                 recent 07/04/2019. CHF, COPD, Signs/Symptoms:Dyspnea; Risk                 Factors:Hypertension, Dyslipidemia and Diabetes.  Sonographer:    Bernadene Person RDCS Referring Phys: [id number] Magnet  1. Global hypokinesis with severe hypokinesis of the inferolateral wall; overall moderate LV dysfunction.  2. Left ventricular ejection fraction, by estimation, is 35 to 40%. The left ventricle has moderately decreased function. The left ventricle demonstrates regional wall motion abnormalities (see scoring diagram/findings for description). The left ventricular internal cavity size was mildly dilated. Left ventricular diastolic parameters are consistent with Grade I diastolic dysfunction (impaired relaxation). Elevated left atrial pressure.  3. Right ventricular systolic function is normal. The right ventricular size is normal.  4. Left atrial size was mildly dilated.  5. The mitral valve is normal in structure. Trivial mitral valve regurgitation. No evidence of mitral stenosis.  6. The aortic valve has an indeterminant number of cusps. Aortic valve regurgitation is trivial. No aortic stenosis  is present.  7. Aortic dilatation noted. There is mild dilatation of the ascending aorta, measuring 40 mm.  8. The inferior vena cava is normal in size with greater than 50% respiratory variability, suggesting right atrial pressure of 3 mmHg. FINDINGS  Left Ventricle: Left ventricular ejection fraction, by estimation, is 35 to 40%. The left ventricle has moderately decreased function. The left ventricle demonstrates regional wall motion abnormalities. Definity contrast agent was given IV to delineate the left ventricular endocardial borders. The left ventricular internal cavity size was mildly dilated. There is no left  ventricular hypertrophy. Left ventricular diastolic parameters are consistent with Grade I diastolic dysfunction (impaired relaxation). Elevated left atrial pressure. Right Ventricle: The right ventricular size is normal. Right ventricular systolic function is normal. Left Atrium: Left atrial size was mildly dilated. Right Atrium: Right atrial size was normal in size. Pericardium: There is no evidence of pericardial effusion. Mitral Valve: The mitral valve is normal in structure. Trivial mitral valve regurgitation. No evidence of mitral valve stenosis. Tricuspid Valve: The tricuspid valve is normal in structure. Tricuspid valve regurgitation is not demonstrated. No evidence of tricuspid stenosis. Aortic Valve: The aortic valve has an indeterminant number of cusps. Aortic valve regurgitation is trivial. No aortic stenosis is present. Pulmonic Valve: The pulmonic valve was not well visualized. Pulmonic valve regurgitation is not visualized. No evidence of pulmonic stenosis. Aorta: Aortic dilatation noted. There is mild dilatation of the ascending aorta, measuring 40 mm. Venous: The inferior vena cava is normal in size with greater than 50% respiratory variability, suggesting right atrial pressure of 3 mmHg. IAS/Shunts: No atrial level shunt detected by color flow Doppler. Additional Comments: Global hypokinesis with severe hypokinesis of the inferolateral wall; overall moderate LV dysfunction.  LEFT VENTRICLE PLAX 2D LVIDd:         5.60 cm      Diastology LVIDs:         4.30 cm      LV e' medial:    3.16 cm/s LV PW:         1.30 cm      LV E/e' medial:  15.0 LV IVS:        1.00 cm      LV e' lateral:   3.78 cm/s LVOT diam:     2.20 cm      LV E/e' lateral: 12.5 LV SV:         43 LV SV Index:   22 LVOT Area:     3.80 cm  LV Volumes (MOD) LV vol d, MOD A2C: 151.0 ml LV vol d, MOD A4C: 170.0 ml LV vol s, MOD A2C: 100.0 ml LV vol s, MOD A4C: 106.0 ml LV SV MOD A2C:     51.0 ml LV SV MOD A4C:     170.0 ml LV SV MOD BP:       58.2 ml RIGHT VENTRICLE RV S prime:     9.53 cm/s TAPSE (M-mode): 1.6 cm LEFT ATRIUM             Index        RIGHT ATRIUM           Index LA diam:        4.60 cm 2.40 cm/m   RA Area:     17.30 cm LA Vol (A2C):   70.6 ml 36.79 ml/m  RA Volume:   44.10 ml  22.98 ml/m LA Vol (A4C):   79.0 ml 41.17 ml/m LA Biplane Vol: 74.8 ml  38.98 ml/m  AORTIC VALVE LVOT Vmax:   48.20 cm/s LVOT Vmean:  30.100 cm/s LVOT VTI:    0.112 m  AORTA Ao Root diam: 3.70 cm Ao Asc diam:  4.00 cm MITRAL VALVE MV Area (PHT): 2.93 cm    SHUNTS MV Decel Time: 259 msec    Systemic VTI:  0.11 m MV E velocity: 47.30 cm/s  Systemic Diam: 2.20 cm MV A velocity: 54.20 cm/s MV E/A ratio:  0.87 Kirk Ruths MD Electronically signed by Kirk Ruths MD Signature Date/Time: 12/19/2021/4:50:55 PM    Final    DG Chest Port 1 View  Result Date: 12/19/2021 CLINICAL DATA:  Dyspnea EXAM: PORTABLE CHEST 1 VIEW COMPARISON:  The 05/02/2020 FINDINGS: The lungs are symmetrically well expanded. There is mild diffuse interstitial pulmonary infiltrate most in keeping with mild cardiogenic failure. Mild cardiomegaly is present. No pneumothorax or pleural effusion. No acute bone abnormality. IMPRESSION: Mild cardiogenic failure. Electronically Signed   By: Fidela Salisbury M.D.   On: 12/19/2021 03:06    Cardiac Studies   TTE 12/19/2021  1. Global hypokinesis with severe hypokinesis of the inferolateral wall;  overall moderate LV dysfunction.   2. Left ventricular ejection fraction, by estimation, is 35 to 40%. The  left ventricle has moderately decreased function. The left ventricle  demonstrates regional wall motion abnormalities (see scoring  diagram/findings for description). The left  ventricular internal cavity size was mildly dilated. Left ventricular  diastolic parameters are consistent with Grade I diastolic dysfunction  (impaired relaxation). Elevated left atrial pressure.   3. Right ventricular systolic function is normal. The right  ventricular  size is normal.   4. Left atrial size was mildly dilated.   5. The mitral valve is normal in structure. Trivial mitral valve  regurgitation. No evidence of mitral stenosis.   6. The aortic valve has an indeterminant number of cusps. Aortic valve  regurgitation is trivial. No aortic stenosis is present.   7. Aortic dilatation noted. There is mild dilatation of the ascending  aorta, measuring 40 mm.   8. The inferior vena cava is normal in size with greater than 50%  respiratory variability, suggesting right atrial pressure of 3 mmHg.   Patient Profile     Gary Morse is a 87 y.o. 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 Tana Coast.  Assessment & Plan     Acute on chronic systolic and diastolic heart failure; here with flash pulmonary edema Respiratory failure Hypertension Given CXR and BNP, suspect presentation is consistent with CHF exacerbation, unable to exclude COPD exacerbation.  Given dietary indiscretion and timing of resolution of respiratory failure, suspect flash pulmonary edema. S/p BIPAP. Now on RA. GDMT: uptitrated coreg, entresto 4-51 mg BID, 10 mg jardiance - started low dose spironolactone (will monitor crt/K) - continue 40 mg IV BID lasix, clinically improving. Likely can transition to oral soon - OOB to chair, ambulation - stopped nitro gtt now that he is on RA  PVCs - heart monitor with 18% PVC burden, has done well on amiodarone - amiodarone was reduced from 200 mg to 100 mg daily 09/2021 - question if dose reduction may have coincided with an increase in PVC burden - he is not bothered by palpitations - continue amiodarone 100 mg for now  For questions or updates, please contact [place] Please  consult [email or web address] for contact info under        Signed, Janina Mayo, MD  12/20/2021, 9:50 AM

## 2021-12-21 DIAGNOSIS — I493 Ventricular premature depolarization: Secondary | ICD-10-CM | POA: Diagnosis not present

## 2021-12-21 DIAGNOSIS — J810 Acute pulmonary edema: Secondary | ICD-10-CM | POA: Diagnosis not present

## 2021-12-21 DIAGNOSIS — J9601 Acute respiratory failure with hypoxia: Secondary | ICD-10-CM | POA: Diagnosis not present

## 2021-12-21 DIAGNOSIS — I5043 Acute on chronic combined systolic (congestive) and diastolic (congestive) heart failure: Secondary | ICD-10-CM | POA: Diagnosis not present

## 2021-12-21 DIAGNOSIS — E039 Hypothyroidism, unspecified: Secondary | ICD-10-CM | POA: Diagnosis not present

## 2021-12-21 LAB — BASIC METABOLIC PANEL
Anion gap: 9 (ref 5–15)
BUN: 24 mg/dL — ABNORMAL HIGH (ref 8–23)
CO2: 26 mmol/L (ref 22–32)
Calcium: 8 mg/dL — ABNORMAL LOW (ref 8.9–10.3)
Chloride: 104 mmol/L (ref 98–111)
Creatinine, Ser: 1.59 mg/dL — ABNORMAL HIGH (ref 0.61–1.24)
GFR, Estimated: 43 mL/min — ABNORMAL LOW (ref >60)
Glucose, Bld: 156 mg/dL — ABNORMAL HIGH (ref 70–99)
Potassium: 3.6 mmol/L (ref 3.5–5.1)
Sodium: 139 mmol/L (ref 135–145)

## 2021-12-21 LAB — GLUCOSE, CAPILLARY
Glucose-Capillary: 145 mg/dL — ABNORMAL HIGH (ref 70–99)
Glucose-Capillary: 139 mg/dL — ABNORMAL HIGH (ref 70–99)

## 2021-12-21 LAB — T4, FREE: Free T4: 1.2 ng/dL — ABNORMAL HIGH (ref 0.61–1.12)

## 2021-12-21 MED ORDER — ALBUTEROL SULFATE HFA 108 (90 BASE) MCG/ACT IN AERS: 2.0000 | INHALATION_SPRAY | Freq: Four times a day (QID) | RESPIRATORY_TRACT | 2 refills | Status: AC | PRN

## 2021-12-21 MED ORDER — PREDNISONE 20 MG PO TABS: 40.0000 mg | ORAL_TABLET | Freq: Every day | ORAL | 0 refills | Status: AC

## 2021-12-21 MED ORDER — SPIRONOLACTONE 25 MG PO TABS: 12.5000 mg | ORAL_TABLET | Freq: Every day | ORAL | 3 refills | Status: DC

## 2021-12-21 MED ORDER — DOXYCYCLINE HYCLATE 100 MG PO CAPS: 100.0000 mg | ORAL_CAPSULE | Freq: Two times a day (BID) | ORAL | 0 refills | Status: AC

## 2021-12-21 MED ORDER — POTASSIUM CHLORIDE CRYS ER 20 MEQ PO TBCR: 20.0000 meq | EXTENDED_RELEASE_TABLET | Freq: Every morning | ORAL | 3 refills | Status: DC

## 2021-12-21 MED ORDER — FUROSEMIDE 40 MG PO TABS
40.0000 mg | ORAL_TABLET | Freq: Two times a day (BID) | ORAL | Status: DC
  Administered 2021-12-21: 40 mg via ORAL
  Filled 2021-12-21: qty 1

## 2021-12-21 MED ORDER — FUROSEMIDE 40 MG PO TABS: 40.0000 mg | ORAL_TABLET | Freq: Two times a day (BID) | ORAL | 3 refills | Status: DC

## 2021-12-21 NOTE — Evaluation (Signed)
Physical Therapy Evaluation Patient Details Name: Shanti Eichel MRN: [id number] DOB: 05-Mar-1935 Today's Date: 12/21/2021  History of Present Illness  Patient is a 87 y/o male who presents on 8/25 with SOB and respiratory distress. Found to have acute hypoxic hypercapnic respiratory failure, flash edema suspected to be on basis of acute on chronic CHF and possible COPD exacberation. PMH includes CHF, COPD, CKD, DM, HTN.  Clinical Impression  Patient presents with hospital acquired weakness/deconditioning and impaired mobility s/p above. Pt lives at home with his son and reports being independent for ADLs/IADLs at baseline. 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 Today, pt requires supervision for transfers and ambulation. Sp02 remained in mid-high 90s on RA throughout activity. Reports being slightly weaker than baseline due to being in bed for 2 days. Likely will return to baseline quickly with increased activity. Will follow acutely to maximize independence and mobility prior to return home.       Recommendations for follow up therapy are one component of a multi-disciplinary discharge planning process, led by the attending physician.  Recommendations may be updated based on patient status, additional functional criteria and insurance authorization.  Follow Up Recommendations No PT follow up      Assistance Recommended at Discharge PRN  Patient can return home with the following  Assistance with cooking/housework;Help with stairs or ramp for entrance;A little help with walking and/or transfers    Equipment Recommendations None recommended by PT  Recommendations for Other Services       Functional Status Assessment Patient has had a recent decline in their functional status and demonstrates the ability to make significant improvements in function in a reasonable and predictable amount of time.     Precautions / Restrictions Precautions Precautions: Fall Restrictions Weight Bearing Restrictions: No       Mobility  Bed Mobility Overal bed mobility: Modified Independent             General bed mobility comments: No assist needed, HOB mostly flat.    Transfers Overall transfer level: Needs assistance Equipment used: None Transfers: Sit to/from Stand Sit to Stand: Supervision           General transfer comment: Supervision for safety. Stood from [employer], from toilet x1, transferred to chair post ambulation.    Ambulation/Gait Ambulation/Gait assistance: Supervision, Min guard Gait Distance (Feet): 200 Feet Assistive device: None Gait Pattern/deviations: Step-through pattern, Decreased stride length, Knee flexed in stance - left, Knee flexed in stance - right   Gait velocity interpretation: 1.31 - 2.62 ft/sec, indicative of limited community ambulator   General Gait Details: Slow, mostly steady gait with knee flexion bilaterally throughout ambulation, walking close to rail for support as needed. Sp02 remained in mid-high 90s on RA throughout.  Stairs            Wheelchair Mobility    Modified Rankin (Stroke Patients Only)       Balance Overall balance assessment: Mild deficits observed, not formally tested                                           Pertinent Vitals/Pain Pain Assessment Pain Assessment: Faces Faces Pain Scale: Hurts a little bit Pain Location: left IV site Pain Descriptors / Indicators: Discomfort Pain Intervention(s): Monitored during session    Home Living Family/patient expects to be discharged to:: Private residence Living Arrangements: Children (son) Available Help at Discharge: Family;Available PRN/intermittently  Type of Home: House Home Access: Stairs to enter   [employer] of Steps: 4 in back   Home Layout: One level Home Equipment: Conservation officer, nature (2 wheels);BSC/3in1;Cane - single point;Wheelchair - manual      Prior Function Prior Level of Function : Independent/Modified Independent              Mobility Comments: Independent, still works on antique cars and washes them ADLs Comments: independent, has a roommate and son who helps at times.     Hand Dominance        Extremity/Trunk Assessment   Upper Extremity Assessment Upper Extremity Assessment: Defer to OT evaluation    Lower Extremity Assessment Lower Extremity Assessment: Generalized weakness (but functional)    Cervical / Trunk Assessment Cervical / Trunk Assessment: Normal  Communication   Communication: HOH  Cognition Arousal/Alertness: Awake/alert Behavior During Therapy: Impulsive Overall Cognitive Status: Within Functional Limits for tasks assessed                                 General Comments: Impulsive which is likely baseline. urgently trying to get to bathroom        General Comments General comments (skin integrity, edema, etc.): Sp02 remained in mid-high 90s on RA throughout activity. Rn made aware.    Exercises     Assessment/Plan    PT Assessment Patient needs continued PT services  PT Problem List Decreased mobility;Decreased activity tolerance;Decreased balance       PT Treatment Interventions Therapeutic activities;Gait training;Stair training;Balance training;Patient/family education    PT Goals (Current goals can be found in the Care Plan section)  Acute Rehab PT Goals Patient Stated Goal: to go home, take a shower and get a nap PT Goal Formulation: With patient Time For Goal Achievement: 01/04/22 Potential to Achieve Goals: Good    Frequency Min 3X/week     Co-evaluation               AM-PAC PT "6 Clicks" Mobility  Outcome Measure Help needed turning from your back to your side while in a flat bed without using bedrails?: None Help needed moving from lying on your back to sitting on the side of a flat bed without using bedrails?: None Help needed moving to and from a bed to a chair (including a wheelchair)?: A Little Help needed  standing up from a chair using your arms (e.g., wheelchair or bedside chair)?: A Little Help needed to walk in hospital room?: A Little Help needed climbing 3-5 steps with a railing? : A Little 6 Click Score: 20    End of Session Equipment Utilized During Treatment: Gait belt Activity Tolerance: Patient tolerated treatment well Patient left: in chair;with call bell/phone within reach Nurse Communication: Mobility status PT Visit Diagnosis: Unsteadiness on feet (R26.81);Difficulty in walking, not elsewhere classified (R26.2)    Time: 5361-4431 PT Time Calculation (min) (ACUTE ONLY): 19 min   Charges:   PT Evaluation $PT Eval Moderate Complexity: 1 Mod          Marisa Severin, PT, DPT Acute Rehabilitation Services Secure chat preferred Office [phone]     Marguarite Arbour A Emeline Simpson 12/21/2021, 12:00 PM

## 2021-12-21 NOTE — Progress Notes (Signed)
SATURATION QUALIFICATIONS: (This note is used to comply with regulatory documentation for home oxygen)  Patient Saturations on Room Air at Rest = 99%  Patient Saturations on Room Air while Ambulating = 95%   Please briefly explain why patient needs home oxygen: Pt does not require supplemental 02.   Marisa Severin, PT, DPT Acute Rehabilitation Services Secure chat preferred Office [phone]

## 2021-12-21 NOTE — Progress Notes (Signed)
Rounding Note    Patient Name: Gary Morse Date of Encounter: 12/21/2021  [place] Cardiologist: Sinclair Grooms, MD   Subjective   Off BIPAP; on RA Bps 170s-180s HR high 50s-60s Net-935 Crt 1.58->1.7->1.59 Wt 172->171->169   Inpatient Medications    Scheduled Meds:  amiodarone  100 mg Oral Daily   arformoterol  15 mcg Nebulization BID   aspirin EC  81 mg Oral Daily   budesonide (PULMICORT) nebulizer solution  0.25 mg Nebulization BID   carvedilol  25 mg Oral BID WC   furosemide  40 mg Intravenous BID   insulin aspart  0-15 Units Subcutaneous TID WC   levothyroxine  50 mcg Oral q morning   methylPREDNISolone (SOLU-MEDROL) injection  80 mg Intravenous Daily   potassium chloride SA  20 mEq Oral q morning   sacubitril-valsartan  1 tablet Oral BID   simvastatin  10 mg Oral QHS   spironolactone  12.5 mg Oral Daily   Continuous Infusions:  doxycycline (VIBRAMYCIN) IV Stopped (12/20/21 2327)   PRN Meds: acetaminophen **OR** acetaminophen, albuterol, nitroGLYCERIN   Vital Signs    Vitals:   12/20/21 1932 12/20/21 1957 12/20/21 2325 12/21/21 0407  BP:  (!) 186/106 (!) 178/110 (!) 174/101  Pulse: 79 62 60 (!) 58  Resp: '17 20 18 20  '$ Temp:  97.6 F (36.4 C) 98.4 F (36.9 C) 97.9 F (36.6 C)  TempSrc:  Oral Oral Oral  SpO2:  97% 97% 97%  Weight:    76.8 kg  Height:        Intake/Output Summary (Last 24 hours) at 12/21/2021 0700 Last data filed at 12/21/2021 0534 Gross per 24 hour  Intake 959.1 ml  Output 1895 ml  Net -935.9 ml      12/21/2021    4:07 AM 12/20/2021    3:39 AM 12/19/2021    6:13 PM  Last 3 Weights  Weight (lbs) 169 lb 5 oz 171 lb 11.8 oz 172 lb 13.5 oz  Weight (kg) 76.8 kg 77.9 kg 78.4 kg      Telemetry    Sinus or ectopic, PVCs less frequent - Personally Reviewed  ECG    NA - Personally Reviewed  Physical Exam   Vitals:   12/20/21 2325 12/21/21 0407  BP: (!) 178/110 (!) 174/101  Pulse: 60 (!) 58  Resp: 18 20   Temp: 98.4 F (36.9 C) 97.9 F (36.6 C)  SpO2: 97% 97%    GEN: No acute distress.  Sleeping comfortably Cardiac: RRR, no murmurs, rubs, or gallops.  Respiratory: Clear to auscultation bilaterally. GI: Soft, nontender, non-distended  MS: No edema; No deformity. Neuro:  Nonfocal  Psych: Normal affect   Labs    High Sensitivity Troponin:   Recent Labs  Lab 12/19/21 0248 12/19/21 0836 12/19/21 1317  TROPONINIHS 26* 32* 28*     Chemistry Recent Labs  Lab 12/19/21 0248 12/19/21 0258 12/19/21 0821 12/20/21 0328 12/21/21 0155  NA 142   < > 145 137 139  K 4.3   < > 4.4 3.7 3.6  CL 108  --   --  105 104  CO2 28  --   --  22 26  GLUCOSE 230*  --   --  167* 156*  BUN 9  --   --  22 24*  CREATININE 1.58*  --   --  1.71* 1.59*  CALCIUM 8.5*  --   --  8.1* 8.0*  MG 2.7*  --   --   --   --  PROT 6.8  --   --   --   --   ALBUMIN 3.2*  --   --   --   --   AST 50*  --   --   --   --   ALT 30  --   --   --   --   ALKPHOS 103  --   --   --   --   BILITOT 0.8  --   --   --   --   GFRNONAA 43*  --   --  39* 43*  ANIONGAP 6  --   --  10 9   < > = values in this interval not displayed.    Lipids No results for input(s): "CHOL", "TRIG", "HDL", "LABVLDL", "LDLCALC", "CHOLHDL" in the last 168 hours.  Hematology Recent Labs  Lab 12/19/21 0248 12/19/21 0258 12/19/21 0821  WBC 8.7  --   --   RBC 5.41  --   --   HGB 14.9 16.3 16.7  HCT 47.2 48.0 49.0  MCV 87.2  --   --   MCH 27.5  --   --   MCHC 31.6  --   --   RDW 17.1*  --   --   PLT 309  --   --    Thyroid  Recent Labs  Lab 12/19/21 0248 12/21/21 0155  TSH 4.919*  --   FREET4  --  1.20*    BNP Recent Labs  Lab 12/19/21 0248  BNP 2,203.7*    DDimer No results for input(s): "DDIMER" in the last 168 hours.   Radiology    ECHOCARDIOGRAM COMPLETE  Result Date: 12/19/2021    ECHOCARDIOGRAM REPORT   Patient Name:   EFFIE JANOSKI Date of Exam: 12/19/2021 Medical Rec #:  [id number]       Height:       68.0 in  Accession #:    [id number]      Weight:       172.4 lb Date of Birth:  [birth year]       BSA:          1.919 m Patient Age:    87 years        BP:           110/84 mmHg Patient Gender: M               HR:           56 bpm. Exam Location:  Inpatient Procedure: 2D Echo, Cardiac Doppler, Color Doppler and Intracardiac            Opacification Agent Indications:    CHF-Acute Systolic G62.69  History:        Patient has prior history of Echocardiogram examinations, most                 recent 07/04/2019. CHF, COPD, Signs/Symptoms:Dyspnea; Risk                 Factors:Hypertension, Dyslipidemia and Diabetes.  Sonographer:    Bernadene Person RDCS Referring Phys: [id number] [place]  1. Global hypokinesis with severe hypokinesis of the inferolateral wall; overall moderate LV dysfunction.  2. Left ventricular ejection fraction, by estimation, is 35 to 40%. The left ventricle has moderately decreased function. The left ventricle demonstrates regional wall motion abnormalities (see scoring diagram/findings for description). The left ventricular internal cavity size was mildly dilated. Left ventricular diastolic parameters are consistent with Grade I diastolic  dysfunction (impaired relaxation). Elevated left atrial pressure.  3. Right ventricular systolic function is normal. The right ventricular size is normal.  4. Left atrial size was mildly dilated.  5. The mitral valve is normal in structure. Trivial mitral valve regurgitation. No evidence of mitral stenosis.  6. The aortic valve has an indeterminant number of cusps. Aortic valve regurgitation is trivial. No aortic stenosis is present.  7. Aortic dilatation noted. There is mild dilatation of the ascending aorta, measuring 40 mm.  8. The inferior vena cava is normal in size with greater than 50% respiratory variability, suggesting right atrial pressure of 3 mmHg. FINDINGS  Left Ventricle: Left ventricular ejection fraction, by estimation, is 35 to 40%. The left  ventricle has moderately decreased function. The left ventricle demonstrates regional wall motion abnormalities. Definity contrast agent was given IV to delineate the left ventricular endocardial borders. The left ventricular internal cavity size was mildly dilated. There is no left ventricular hypertrophy. Left ventricular diastolic parameters are consistent with Grade I diastolic dysfunction (impaired relaxation). Elevated left atrial pressure. Right Ventricle: The right ventricular size is normal. Right ventricular systolic function is normal. Left Atrium: Left atrial size was mildly dilated. Right Atrium: Right atrial size was normal in size. Pericardium: There is no evidence of pericardial effusion. Mitral Valve: The mitral valve is normal in structure. Trivial mitral valve regurgitation. No evidence of mitral valve stenosis. Tricuspid Valve: The tricuspid valve is normal in structure. Tricuspid valve regurgitation is not demonstrated. No evidence of tricuspid stenosis. Aortic Valve: The aortic valve has an indeterminant number of cusps. Aortic valve regurgitation is trivial. No aortic stenosis is present. Pulmonic Valve: The pulmonic valve was not well visualized. Pulmonic valve regurgitation is not visualized. No evidence of pulmonic stenosis. Aorta: Aortic dilatation noted. There is mild dilatation of the ascending aorta, measuring 40 mm. Venous: The inferior vena cava is normal in size with greater than 50% respiratory variability, suggesting right atrial pressure of 3 mmHg. IAS/Shunts: No atrial level shunt detected by color flow Doppler. Additional Comments: Global hypokinesis with severe hypokinesis of the inferolateral wall; overall moderate LV dysfunction.  LEFT VENTRICLE PLAX 2D LVIDd:         5.60 cm      Diastology LVIDs:         4.30 cm      LV e' medial:    3.16 cm/s LV PW:         1.30 cm      LV E/e' medial:  15.0 LV IVS:        1.00 cm      LV e' lateral:   3.78 cm/s LVOT diam:     2.20 cm       LV E/e' lateral: 12.5 LV SV:         43 LV SV Index:   22 LVOT Area:     3.80 cm  LV Volumes (MOD) LV vol d, MOD A2C: 151.0 ml LV vol d, MOD A4C: 170.0 ml LV vol s, MOD A2C: 100.0 ml LV vol s, MOD A4C: 106.0 ml LV SV MOD A2C:     51.0 ml LV SV MOD A4C:     170.0 ml LV SV MOD BP:      58.2 ml RIGHT VENTRICLE RV S prime:     9.53 cm/s TAPSE (M-mode): 1.6 cm LEFT ATRIUM             Index        RIGHT ATRIUM  Index LA diam:        4.60 cm 2.40 cm/m   RA Area:     17.30 cm LA Vol (A2C):   70.6 ml 36.79 ml/m  RA Volume:   44.10 ml  22.98 ml/m LA Vol (A4C):   79.0 ml 41.17 ml/m LA Biplane Vol: 74.8 ml 38.98 ml/m  AORTIC VALVE LVOT Vmax:   48.20 cm/s LVOT Vmean:  30.100 cm/s LVOT VTI:    0.112 m  AORTA Ao Root diam: 3.70 cm Ao Asc diam:  4.00 cm MITRAL VALVE MV Area (PHT): 2.93 cm    SHUNTS MV Decel Time: 259 msec    Systemic VTI:  0.11 m MV E velocity: 47.30 cm/s  Systemic Diam: 2.20 cm MV A velocity: 54.20 cm/s MV E/A ratio:  0.87 Kirk Ruths MD Electronically signed by Kirk Ruths MD Signature Date/Time: 12/19/2021/4:50:55 PM    Final     Cardiac Studies   TTE 12/19/2021  1. Global hypokinesis with severe hypokinesis of the inferolateral wall;  overall moderate LV dysfunction.   2. Left ventricular ejection fraction, by estimation, is 35 to 40%. The  left ventricle has moderately decreased function. The left ventricle  demonstrates regional wall motion abnormalities (see scoring  diagram/findings for description). The left  ventricular internal cavity size was mildly dilated. Left ventricular  diastolic parameters are consistent with Grade I diastolic dysfunction  (impaired relaxation). Elevated left atrial pressure.   3. Right ventricular systolic function is normal. The right ventricular  size is normal.   4. Left atrial size was mildly dilated.   5. The mitral valve is normal in structure. Trivial mitral valve  regurgitation. No evidence of mitral stenosis.   6. The aortic  valve has an indeterminant number of cusps. Aortic valve  regurgitation is trivial. No aortic stenosis is present.   7. Aortic dilatation noted. There is mild dilatation of the ascending  aorta, measuring 40 mm.   8. The inferior vena cava is normal in size with greater than 50%  respiratory variability, suggesting right atrial pressure of 3 mmHg.   Patient Profile     Awesome Jared is a 87 y.o. 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 Tana Coast.  Assessment & Plan     Acute on chronic systolic and diastolic heart failure; here with flash pulmonary edema Respiratory failure Hypertension Given CXR and BNP, suspect presentation is consistent with CHF exacerbation, unable to exclude COPD exacerbation.  Given dietary indiscretion and timing of resolution of respiratory failure, suspect flash pulmonary edema. S/p BIPAP. Now on RA. GDMT: uptitrated coreg, entresto 4-51 mg BID, 10 mg jardiance - started low dose spironolactone (will monitor crt/K) - s/p 40 mg IV BID lasix, will transition to oral home lasix - OOB to chair, ambulation - stopped nitro gtt now that he is on RA  PVCs - heart monitor with 18% PVC burden, has done well on amiodarone - amiodarone was reduced from 200 mg to 100 mg daily 09/2021 - increased his coreg seems to have reduced his burden - continue amiodarone 100 mg   Will arrange for a follow-up appointment. Otherwise, if he is able to ambulate and feels back to baseline on oral lasix. He can be discharged from a cardiology standpoint    For questions or  updates, please contact [place] Please consult [email or web address] for contact info under        Signed, Janina Mayo, MD  12/21/2021, 7:00 AM

## 2021-12-21 NOTE — Discharge Summary (Addendum)
Physician Discharge Summary   Patient: Gary Morse MRN: [id number] DOB: July 15, 1934  Admit date:     12/19/2021  Discharge date: 12/21/21  Discharge Physician: Estill Cotta, MD    PCP: [name], MD   Recommendations at discharge:    Resume Lasix '40mg'$  PO BID Started Aldactone 12.'5mg'$  daily needs BMET to follow renal function, K  Continue Prednisone '40mg'$  daily x 5 days, doxycycline '100mg'$  BID x 5days    Discharge Diagnoses:    Acute respiratory failure with hypoxia and hypercapnia (HCC)   Acute on chronic combined systolic and diastolic CHF (congestive heart failure) with flash pulmonary edema (HCC)   COPD with acute exacerbation (HCC)   Stage 3a chronic kidney disease (CKD) (HCC)   DM2 (diabetes mellitus, type 2) ([place])   HLD (hyperlipidemia)   Acquired hypothyroidism   Hospital Course:  Patient is a 87 year old male with chronic systolic/diastolic CHF, COPD, diabetes mellitus type 2, HTN, HLP, hypothyroidism, stage IIIa CKD baseline creatinine 1.3-1.6 presented with shortness of breath.  Patient reported 1 day of progressive shortness of breath associated with orthopnea, worsening lower extremity edema.  He also noted new onset nonproductive cough, no hemoptysis associated with wheezing.  No fevers or chills or generalized myalgias.  Not on O2 at baseline. Patient was brought to ED via EMS, initial sats in 80s on room air. VBG pH 7.14, PCO2 87, patient was placed on BiPAP Additionally, placed on nitroglycerin drip, received Vasotec 1.25 mg IV x1, Lasix 40 mg IV x1 Admitted for further work-up.   Assessment and Plan:  Acute respiratory failure with hypoxia and hypercapnia (HCC), multifactorial Underlying history of COPD with acute exacerbation,  Acute on combined chronic CHF with flash pulm edema  -Improving, required Bipap on admission, now on room air -Placed on IV lasix for diuresis, negative balance of 2.5 L -Cardiology was consulted uptitrated Coreg, Entresto,  Jardiance -Creatinine improved to 1.5, transitioned to oral lasix - home O2 eval done, does not need any O2 with ambulation       Acute on chronic combined systolic and diastolic CHF (congestive heart failure) (HCC) with flash pulmonary edema -2D echo showed EF of 35 to 40%, G1 DD global hypokinesis with severe hypokinesis of the inferior lateral wall, normal right ventricular systolic function - transitioned to oral lasix '40mg'$  BID, added low dose aldactone - continue Coreg, Entresto, jardiance  -outpatient follow-up, needs BMET      COPD with acute exacerbation ([place]) -Counseled on nicotine cessation -Placed on IV solumedrol, transitioned to oral prednisone '40mg'$  daily x 5days  -Continue albuterol inhaler as needed       Stage 3a chronic kidney disease (CKD) (HCC) -Baseline creatinine 1.3-1.6 -Creatinine improved to 1.5, at baseline        DM2 (diabetes mellitus, type 2) ([place]), NIDDM, with CKD  -A1c 6.0, CBGs elevated likely due to steroids, - continue Jardiance      HLD (hyperlipidemia) -Continue simvastatin     Acquired hypothyroidism Continue Synthroid      Pain control - [place] Controlled Substance Reporting System database was reviewed. and patient was instructed, not to drive, operate heavy machinery, perform activities at heights, swimming or participation in water activities or provide baby-sitting services while on Pain, Sleep and Anxiety Medications; until their outpatient Physician has advised to do so again. Also recommended to not to take more than prescribed Pain, Sleep and Anxiety Medications.  Consultants: cardiology  Procedures performed: echo  Disposition: Home Diet recommendation:  Discharge Diet Orders (From admission, onward)  Start     Ordered   12/21/21 0000  Diet - low sodium heart healthy        12/21/21 1447            DISCHARGE MEDICATION: Allergies as of 12/21/2021       Reactions   Metformin And Related Other (See Comments)    Unknown reaction   Penicillins Hives   Welps developed. Has patient had a PCN reaction causing immediate rash, facial/tongue/throat swelling, SOB or lightheadedness with hypotension: No Has patient had a PCN reaction causing severe rash involving mucus membranes or skin necrosis: No Has patient had a PCN reaction that required hospitalization: No Has patient had a PCN reaction occurring within the last 10 years: No If all of the above answers are "NO", then may proceed with Cephalosporin use.        Medication List     TAKE these medications    albuterol 108 (90 Base) MCG/ACT inhaler Commonly known as: VENTOLIN HFA Inhale 2 puffs into the lungs every 6 (six) hours as needed for wheezing or shortness of breath.   amiodarone 100 MG tablet Commonly known as: Pacerone Take 1 tablet (100 mg total) by mouth daily. What changed: when to take this   aspirin EC 81 MG tablet Take 81 mg by mouth daily with breakfast.   carvedilol 12.5 MG tablet Commonly known as: COREG Take 25 mg by mouth 2 (two) times daily with a meal.   doxycycline 100 MG capsule Commonly known as: Vibramycin Take 1 capsule (100 mg total) by mouth 2 (two) times daily for 5 days.   Entresto 49-51 MG Generic drug: sacubitril-valsartan Take 1 tablet by mouth 2 (two) times daily. What changed: when to take this   furosemide 40 MG tablet Commonly known as: LASIX Take 1 tablet (40 mg total) by mouth 2 (two) times daily. What changed: See the new instructions.   Jardiance 10 MG Tabs tablet Generic drug: empagliflozin Take 10 mg by mouth daily with breakfast.   levothyroxine 50 MCG tablet Commonly known as: SYNTHROID Take 50 mcg by mouth daily with breakfast.   potassium chloride SA 20 MEQ tablet Commonly known as: KLOR-CON M Take 1 tablet (20 mEq total) by mouth every morning. What changed: when to take this   predniSONE 20 MG tablet Commonly known as: DELTASONE Take 2 tablets (40 mg total) by mouth  daily with breakfast for 5 days. Start taking on: December 22, 2021   simvastatin 10 MG tablet Commonly known as: ZOCOR Take 10 mg by mouth daily with supper.   spironolactone 25 MG tablet Commonly known as: ALDACTONE Take 0.5 tablets (12.5 mg total) by mouth daily. Start taking on: December 22, 2021        Follow-up Information     Belva Crome, MD Follow up.   Specialty: Cardiology Why: The office should call you within 1-2 business days to arrange follow-up. If you do not hear from them within this time frame, please call the number provided. Contact information: [address] [place] [address] [phone]         [name], MD. Schedule an appointment as soon as possible for a visit in 2 week(s).   Specialty: Internal Medicine Why: for hospital follow-up Contact information: [address], [address] [place] [address] [phone]         Belva Crome, MD .   Specialty: Cardiology Contact information: [phone]  [place] [address] [phone]                Discharge Exam: Filed Weights   12/19/21 1813 12/20/21 0339 12/21/21 0407  Weight: 78.4 kg 77.9 kg 76.8 kg   S: No acute issues, CP or shortness of breath. Ambulating.   Vitals:   12/21/21 0407 12/21/21 0756 12/21/21 0846 12/21/21 0847  BP: (!) 174/101 (!) 168/95    Pulse: (!) 58 (!) 56    Resp: 20 17    Temp: 97.9 F (36.6 C) 97.6 F (36.4 C)    TempSrc: Oral Oral    SpO2: 97% 92% 96% 96%  Weight: 76.8 kg     Height:        Physical Exam General: Alert and oriented x 3, NAD Cardiovascular: S1 S2 clear, RRR.  Respiratory: CTAB, no wheezing, rales or rhonchi Gastrointestinal: Soft, nontender, nondistended, NBS Ext: no pedal edema bilaterally Neuro: no new deficits    Condition at discharge: good  The results of significant diagnostics from this hospitalization (including imaging, microbiology, ancillary and  laboratory) are listed below for reference.   Imaging Studies: ECHOCARDIOGRAM COMPLETE  Result Date: 12/19/2021    ECHOCARDIOGRAM REPORT   Patient Name:   ALPHONSA BRICKLE Date of Exam: 12/19/2021 Medical Rec #:  [id number]       Height:       68.0 in Accession #:    [id number]      Weight:       172.4 lb Date of Birth:  17-Jan-1935       BSA:          1.919 m Patient Age:    63 years        BP:           110/84 mmHg Patient Gender: M               HR:           56 bpm. Exam Location:  Inpatient Procedure: 2D Echo, Cardiac Doppler, Color Doppler and Intracardiac            Opacification Agent Indications:    CHF-Acute Systolic B51.02  History:        Patient has prior history of Echocardiogram examinations, most                 recent 07/04/2019. CHF, COPD, Signs/Symptoms:Dyspnea; Risk                 Factors:Hypertension, Dyslipidemia and Diabetes.  Sonographer:    Bernadene Person RDCS Referring Phys: [id number] [place]  1. Global hypokinesis with severe hypokinesis of the inferolateral wall; overall moderate LV dysfunction.  2. Left ventricular ejection fraction, by estimation, is 35 to 40%. The left ventricle has moderately decreased function. The left ventricle demonstrates regional wall motion abnormalities (see scoring diagram/findings for description). The left ventricular internal cavity size was mildly dilated. Left ventricular diastolic parameters are consistent with Grade I diastolic dysfunction (impaired relaxation). Elevated left atrial pressure.  3. Right ventricular systolic function is normal. The right ventricular size is normal.  4. Left atrial size was mildly dilated.  5. The mitral valve is normal in structure. Trivial mitral valve regurgitation. No evidence of mitral stenosis.  6. The aortic valve has an indeterminant number of cusps. Aortic valve regurgitation is trivial. No aortic stenosis is present.  7. Aortic dilatation noted. There is mild dilatation of the ascending  aorta, measuring 40 mm.  8. The inferior vena  cava is normal in size with greater than 50% respiratory variability, suggesting right atrial pressure of 3 mmHg. FINDINGS  Left Ventricle: Left ventricular ejection fraction, by estimation, is 35 to 40%. The left ventricle has moderately decreased function. The left ventricle demonstrates regional wall motion abnormalities. Definity contrast agent was given IV to delineate the left ventricular endocardial borders. The left ventricular internal cavity size was mildly dilated. There is no left ventricular hypertrophy. Left ventricular diastolic parameters are consistent with Grade I diastolic dysfunction (impaired relaxation). Elevated left atrial pressure. Right Ventricle: The right ventricular size is normal. Right ventricular systolic function is normal. Left Atrium: Left atrial size was mildly dilated. Right Atrium: Right atrial size was normal in size. Pericardium: There is no evidence of pericardial effusion. Mitral Valve: The mitral valve is normal in structure. Trivial mitral valve regurgitation. No evidence of mitral valve stenosis. Tricuspid Valve: The tricuspid valve is normal in structure. Tricuspid valve regurgitation is not demonstrated. No evidence of tricuspid stenosis. Aortic Valve: The aortic valve has an indeterminant number of cusps. Aortic valve regurgitation is trivial. No aortic stenosis is present. Pulmonic Valve: The pulmonic valve was not well visualized. Pulmonic valve regurgitation is not visualized. No evidence of pulmonic stenosis. Aorta: Aortic dilatation noted. There is mild dilatation of the ascending aorta, measuring 40 mm. Venous: The inferior vena cava is normal in size with greater than 50% respiratory variability, suggesting right atrial pressure of 3 mmHg. IAS/Shunts: No atrial level shunt detected by color flow Doppler. Additional Comments: Global hypokinesis with severe hypokinesis of the inferolateral wall; overall moderate LV  dysfunction.  LEFT VENTRICLE PLAX 2D LVIDd:         5.60 cm      Diastology LVIDs:         4.30 cm      LV e' medial:    3.16 cm/s LV PW:         1.30 cm      LV E/e' medial:  15.0 LV IVS:        1.00 cm      LV e' lateral:   3.78 cm/s LVOT diam:     2.20 cm      LV E/e' lateral: 12.5 LV SV:         43 LV SV Index:   22 LVOT Area:     3.80 cm  LV Volumes (MOD) LV vol d, MOD A2C: 151.0 ml LV vol d, MOD A4C: 170.0 ml LV vol s, MOD A2C: 100.0 ml LV vol s, MOD A4C: 106.0 ml LV SV MOD A2C:     51.0 ml LV SV MOD A4C:     170.0 ml LV SV MOD BP:      58.2 ml RIGHT VENTRICLE RV S prime:     9.53 cm/s TAPSE (M-mode): 1.6 cm LEFT ATRIUM             Index        RIGHT ATRIUM           Index LA diam:        4.60 cm 2.40 cm/m   RA Area:     17.30 cm LA Vol (A2C):   70.6 ml 36.79 ml/m  RA Volume:   44.10 ml  22.98 ml/m LA Vol (A4C):   79.0 ml 41.17 ml/m LA Biplane Vol: 74.8 ml 38.98 ml/m  AORTIC VALVE LVOT Vmax:   48.20 cm/s LVOT Vmean:  30.100 cm/s LVOT VTI:    0.112 m  AORTA Ao Root diam: 3.70 cm Ao Asc diam:  4.00 cm MITRAL VALVE MV Area (PHT): 2.93 cm    SHUNTS MV Decel Time: 259 msec    Systemic VTI:  0.11 m MV E velocity: 47.30 cm/s  Systemic Diam: 2.20 cm MV A velocity: 54.20 cm/s MV E/A ratio:  0.87 Kirk Ruths MD Electronically signed by Kirk Ruths MD Signature Date/Time: 12/19/2021/4:50:55 PM    Final    DG Chest Port 1 View  Result Date: 12/19/2021 CLINICAL DATA:  Dyspnea EXAM: PORTABLE CHEST 1 VIEW COMPARISON:  The 05/02/2020 FINDINGS: The lungs are symmetrically well expanded. There is mild diffuse interstitial pulmonary infiltrate most in keeping with mild cardiogenic failure. Mild cardiomegaly is present. No pneumothorax or pleural effusion. No acute bone abnormality. IMPRESSION: Mild cardiogenic failure. Electronically Signed   By: Fidela Salisbury M.D.   On: 12/19/2021 03:06    Microbiology: Results for orders placed or performed during the hospital encounter of 12/19/21  SARS Coronavirus 2 by  RT PCR (hospital order, performed in [facility] hospital lab) *cepheid single result test* Anterior Nasal Swab     Status: None   Collection Time: 12/19/21  6:35 AM   Specimen: Anterior Nasal Swab  Result Value Ref Range Status   SARS Coronavirus 2 by RT PCR NEGATIVE NEGATIVE Final    Comment: (NOTE) SARS-CoV-2 target nucleic acids are NOT DETECTED.  The SARS-CoV-2 RNA is generally detectable in upper and lower respiratory specimens during the acute phase of infection. The lowest concentration of SARS-CoV-2 viral copies this assay can detect is 250 copies / mL. A negative result does not preclude SARS-CoV-2 infection and should not be used as the sole basis for treatment or other patient management decisions.  A negative result may occur with improper specimen collection / handling, submission of specimen other than nasopharyngeal swab, presence of viral mutation(s) within the areas targeted by this assay, and inadequate number of viral copies (<250 copies / mL). A negative result must be combined with clinical observations, patient history, and epidemiological information.  Fact Sheet for Patients:   https://www.patel.info/  Fact Sheet for Healthcare Providers: https://hall.com/  This test is not yet approved or  cleared by the [place] FDA and has been authorized for detection and/or diagnosis of SARS-CoV-2 by FDA under an Emergency Use Authorization (EUA).  This EUA will remain in effect (meaning this test can be used) for the duration of the COVID-19 declaration under Section 564(b)(1) of the Act, 21 U.S.C. section 360bbb-3(b)(1), unless the authorization is terminated or revoked sooner.  Performed at [facility], [address] [place], [place] [address]     Labs: CBC: Recent Labs  Lab 12/19/21 0248 12/19/21 0258 12/19/21 0821  WBC 8.7  --   --   NEUTROABS 4.9  --   --   HGB 14.9 16.3 16.7  HCT 47.2 48.0  49.0  MCV 87.2  --   --   PLT 309  --   --    Basic Metabolic Panel: Recent Labs  Lab 12/19/21 0248 12/19/21 0258 12/19/21 0821 12/20/21 0328 12/21/21 0155  NA 142 144 145 137 139  K 4.3 4.6 4.4 3.7 3.6  CL 108  --   --  105 104  CO2 28  --   --  22 26  GLUCOSE 230*  --   --  167* 156*  BUN 9  --   --  22 24*  CREATININE 1.58*  --   --  1.71* 1.59*  CALCIUM 8.5*  --   --  8.1* 8.0*  MG 2.7*  --   --   --   --   PHOS 5.0*  --   --   --   --    Liver Function Tests: Recent Labs  Lab 12/19/21 0248  AST 50*  ALT 30  ALKPHOS 103  BILITOT 0.8  PROT 6.8  ALBUMIN 3.2*   CBG: Recent Labs  Lab 12/20/21 1049 12/20/21 1640 12/20/21 2129 12/21/21 0619 12/21/21 1057  GLUCAP 181* 159* 174* 145* 139*    Discharge time spent: greater than 30 minutes.  Signed: Estill Cotta, MD [facility] 12/21/2021

## 2021-12-21 NOTE — Progress Notes (Signed)
D/c tele and IV. Went over AVS with pt and all question were addressed.   Lavenia Atlas, RN

## 2021-12-22 ENCOUNTER — Other Ambulatory Visit: Payer: Self-pay | Admitting: Interventional Cardiology

## 2021-12-23 LAB — T3: T3, Total: 56 ng/dL — ABNORMAL LOW (ref 71–180)

## 2022-01-04 NOTE — Progress Notes (Deleted)
Office Visit    Patient Name: Gary Morse Date of Encounter: 01/04/2022  Primary Care Provider:  [name], MD Primary Cardiologist:  Sinclair Grooms, MD Primary Electrophysiologist: None  Chief Complaint    Gary Morse is a 87 y.o. 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  Past Medical History    Past Medical History:  Diagnosis Date   Adenomatous polyp    POSITIVE, COLON 7/18, [place] COLON NEGATIVE   Allergic rhinitis    BPH (benign prostatic hyperplasia)    MICROSCOPIC HEMATURIA WORKUP IN 2004 WITH UROLOGY NEGATIVE, DR. Karsten Ro   CHF (congestive heart failure) ([place])    EPISODE IN 1994 SECONDARY TO HYPERTENSION, ECHO 2005 NORMAL LV FUNCTION   CKD (chronic kidney disease)    STAGE 2 MICROALBUMIN POSITIVE   Colon polyp    COLON IN 2018   COPD (chronic obstructive pulmonary disease) ([place])    ON X-RAY, CIGAR USE--PFT IN 2013 NORMAL   Diabetes type 2, controlled ([place])    Dyslipidemia    Dyspnea    ED (erectile dysfunction)    Hypertension    Patellar bursitis of right knee    PRE-PATELLA BURSITIS WITH INTERMITTENT SWELLING    Past Surgical History:  Procedure Laterality Date   COLONOSCOPY  12/2010   10/2016   INGUINAL HERNIA REPAIR Right    1976    Allergies  Allergies  Allergen Reactions   Metformin And Related Other (See Comments)    Unknown reaction   Penicillins Hives    Welps developed. Has patient had a PCN reaction causing immediate rash, facial/tongue/throat swelling, SOB or lightheadedness with hypotension: No Has patient had a PCN reaction causing severe rash involving mucus membranes or skin necrosis: No Has patient had a PCN reaction that required hospitalization: No Has patient had a PCN reaction occurring within the last 10 years: No If all of the above  answers are "NO", then may proceed with Cephalosporin use.       History of Present Illness    Gary Morse is a 87 year old male with the above-mentioned past medical history who presents today for follow-up of recent admission for CHF exacerbation.  Mr. Campas was originally seen by Dr. Tamala Julian in 2019 when he was seen for complaint of dyspnea on exertion.  2D echo was performed and revealed EF of 25-30%.  EKG was performed that showed significant irregular heart rhythm with PACs and PVCs.  He was also noted to have RBBB.  He wore 24-hour Holter monitor to exclude possible AF.  He was found to have 18% PVC burden with no atrial fibrillation.  This was thought to be a major contributor to his LV dysfunction leading to CHF.  He was started on GDMT and repeat 2D echo remain low with EF of 15-20%.  He was referred to Dr. Lovena Le on 03/2018 and was not considered a candidate for ICD therapy.  He was started on amiodarone for suppression of PVCs.  Repeat 2D echo was performed 06/2019 that showed EF of 35-40% with grade 1 DD.  He was encouraged to continue GDMT at that time and SGLT2 was added to regimen.  Amiodarone was also reduced to 100 mg daily due to elevated TSH level.  Mr. Ehly presented to [facility] ED on 12/19/2021 with onset of shortness of breath.  He was treated with  albuterol by EMS and admitted for possible CHF/COPD exacerbation.  BNP was 2204 and at bedtime troponin trended from 36 up to 32.  He was placed on BiPAP and was given nitroglycerin drip for elevated BP.  Mild cardiomegaly was noted on CXR and repeat TTE with EF 35-40%, global hypokinesis with severe hypokinesis of inferior lateral wall with no evidence of mitral or aortic stenosis.  Mild dilation of ascending aorta at 40 mm was noted as well.  He was treated with IV Lasix and diuresed 2.5 L and was treated with oral prednisone for COPD exacerbation.  He was discharged home on 12/21/2021  Since last being seen in the office patient  reports***.  Patient denies chest pain, palpitations, dyspnea, PND, orthopnea, nausea, vomiting, dizziness, syncope, edema, weight gain, or early satiety.   ***Notes: -Needs  BMET -Current GDMT consist of Entresto, Jardiance, Coreg, Aldactone, Lasix 40 mg Home Medications    Current Outpatient Medications  Medication Sig Dispense Refill   albuterol (VENTOLIN HFA) 108 (90 Base) MCG/ACT inhaler Inhale 2 puffs into the lungs every 6 (six) hours as needed for wheezing or shortness of breath. 8 g 2   amiodarone (PACERONE) 100 MG tablet Take 1 tablet (100 mg total) by mouth daily. (Patient taking differently: Take 100 mg by mouth daily with breakfast.) 90 tablet 3   aspirin EC 81 MG tablet Take 81 mg by mouth daily with breakfast.     carvedilol (COREG) 12.5 MG tablet Take 25 mg by mouth 2 (two) times daily with a meal.     empagliflozin (JARDIANCE) 10 MG TABS tablet Take 10 mg by mouth daily with breakfast.     furosemide (LASIX) 40 MG tablet Take 1 tablet (40 mg total) by mouth 2 (two) times daily. 60 tablet 3   levothyroxine (SYNTHROID) 50 MCG tablet Take 50 mcg by mouth daily with breakfast.     potassium chloride SA (KLOR-CON M) 20 MEQ tablet Take 1 tablet (20 mEq total) by mouth every morning. 90 tablet 3   sacubitril-valsartan (ENTRESTO) 49-51 MG Take 1 tablet by mouth 2 (two) times daily. (Patient taking differently: Take 1 tablet by mouth 2 (two) times daily with a meal.) 180 tablet 3   simvastatin (ZOCOR) 10 MG tablet Take 10 mg by mouth daily with supper.     spironolactone (ALDACTONE) 25 MG tablet Take 0.5 tablets (12.5 mg total) by mouth daily. 30 tablet 3   No current facility-administered medications for this visit.     Review of Systems  Please see the history of present illness.    (+)*** (+)***  All other systems reviewed and are otherwise negative except as noted above.  Physical Exam    Wt Readings from Last 3 Encounters:  12/21/21 169 lb 5 oz (76.8 kg)  10/06/21 172  lb 6.4 oz (78.2 kg)  05/10/20 180 lb (81.6 kg)   PQ:DIYME were no vitals filed for this visit.,There is no height or weight on file to calculate BMI.  Constitutional:      Appearance: Healthy appearance. Not in distress.  Neck:     Vascular: JVD normal.  Pulmonary:     Effort: Pulmonary effort is normal.     Breath sounds: No wheezing. No rales. Diminished in the bases Cardiovascular:     Normal rate. Regular rhythm. Normal S1. Normal S2.      Murmurs: There is no murmur.  Edema:    Peripheral edema absent.  Abdominal:     Palpations: Abdomen is soft  non tender. There is no hepatomegaly.  Skin:    General: Skin is warm and dry.  Neurological:     General: No focal deficit present.     Mental Status: Alert and oriented to person, place and time.     Cranial Nerves: Cranial nerves are intact.  EKG/LABS/Other Studies Reviewed    ECG personally reviewed by me today - ***  Risk Assessment/Calculations:   {Does this patient have ATRIAL FIBRILLATION?[phone]}        Lab Results  Component Value Date   WBC 8.7 12/19/2021   HGB 16.7 12/19/2021   HCT 49.0 12/19/2021   MCV 87.2 12/19/2021   PLT 309 12/19/2021   Lab Results  Component Value Date   CREATININE 1.59 (H) 12/21/2021   BUN 24 (H) 12/21/2021   NA 139 12/21/2021   K 3.6 12/21/2021   CL 104 12/21/2021   CO2 26 12/21/2021   Lab Results  Component Value Date   ALT 30 12/19/2021   AST 50 (H) 12/19/2021   ALKPHOS 103 12/19/2021   BILITOT 0.8 12/19/2021   No results found for: "CHOL", "HDL", "LDLCALC", "LDLDIRECT", "TRIG", "CHOLHDL"  Lab Results  Component Value Date   HGBA1C 6.0 (H) 12/19/2021    Assessment & Plan    1.  Chronic systolic and diastolic CHF: --2D echo showed EF of 35 to 40%, G1 DD global hypokinesis with severe hypokinesis of the inferior lateral wall, normal right ventricular systolic function -Continue current GDMT with carvedilol, Jardiance, Entresto, Lasix 40 mg and Aldactone  2.   HTN: -Blood pressure today was*** -Continue carvedilol 12.5, Aldactone 25 mg  3.  COPD: -Patient currently followed by pulmonology -Continue albuterol inhalers -Smoking cessation encouraged  4.  PVCs: -Continue amiodarone 100 mg daily   5.  DM type II: -Most recent hemoglobin A1c was 6.0 -Continue Jardiance as noted above      Disposition: Follow-up with Belva Crome III, MD or APP in *** months {Are you ordering a CV Procedure (e.g. stress test, cath, DCCV, TEE, etc)?   Press F2        :[id number]}   Medication Adjustments/Labs and Tests Ordered: Current medicines are reviewed at length with the patient today.  Concerns regarding medicines are outlined above.   Signed, Mable Fill, Marissa Nestle, NP 01/04/2022, 11:10 AM [place]

## 2022-01-06 ENCOUNTER — Ambulatory Visit: Payer: Medicare HMO | Attending: Nurse Practitioner | Admitting: Nurse Practitioner

## 2022-01-12 DIAGNOSIS — N1831 Chronic kidney disease, stage 3a: Secondary | ICD-10-CM | POA: Diagnosis not present

## 2022-01-12 DIAGNOSIS — I5023 Acute on chronic systolic (congestive) heart failure: Secondary | ICD-10-CM | POA: Diagnosis not present

## 2022-01-12 DIAGNOSIS — Z23 Encounter for immunization: Secondary | ICD-10-CM | POA: Diagnosis not present

## 2022-01-12 DIAGNOSIS — E1122 Type 2 diabetes mellitus with diabetic chronic kidney disease: Secondary | ICD-10-CM | POA: Diagnosis not present

## 2022-01-12 DIAGNOSIS — I10 Essential (primary) hypertension: Secondary | ICD-10-CM | POA: Diagnosis not present

## 2022-01-12 DIAGNOSIS — J449 Chronic obstructive pulmonary disease, unspecified: Secondary | ICD-10-CM | POA: Diagnosis not present

## 2022-01-22 DIAGNOSIS — E1122 Type 2 diabetes mellitus with diabetic chronic kidney disease: Secondary | ICD-10-CM | POA: Diagnosis not present

## 2022-01-22 DIAGNOSIS — I10 Essential (primary) hypertension: Secondary | ICD-10-CM | POA: Diagnosis not present

## 2022-01-22 DIAGNOSIS — N1831 Chronic kidney disease, stage 3a: Secondary | ICD-10-CM | POA: Diagnosis not present

## 2022-01-22 DIAGNOSIS — E782 Mixed hyperlipidemia: Secondary | ICD-10-CM | POA: Diagnosis not present

## 2022-01-22 DIAGNOSIS — E039 Hypothyroidism, unspecified: Secondary | ICD-10-CM | POA: Diagnosis not present

## 2022-02-10 DIAGNOSIS — I10 Essential (primary) hypertension: Secondary | ICD-10-CM | POA: Diagnosis not present

## 2022-02-10 DIAGNOSIS — R066 Hiccough: Secondary | ICD-10-CM | POA: Diagnosis not present

## 2022-02-18 DIAGNOSIS — E782 Mixed hyperlipidemia: Secondary | ICD-10-CM | POA: Diagnosis not present

## 2022-02-18 DIAGNOSIS — I5020 Unspecified systolic (congestive) heart failure: Secondary | ICD-10-CM | POA: Diagnosis not present

## 2022-02-18 DIAGNOSIS — I10 Essential (primary) hypertension: Secondary | ICD-10-CM | POA: Diagnosis not present

## 2022-02-18 DIAGNOSIS — E1122 Type 2 diabetes mellitus with diabetic chronic kidney disease: Secondary | ICD-10-CM | POA: Diagnosis not present

## 2022-02-18 DIAGNOSIS — E039 Hypothyroidism, unspecified: Secondary | ICD-10-CM | POA: Diagnosis not present

## 2022-02-18 DIAGNOSIS — N1831 Chronic kidney disease, stage 3a: Secondary | ICD-10-CM | POA: Diagnosis not present

## 2022-03-24 ENCOUNTER — Telehealth: Payer: Self-pay | Admitting: Interventional Cardiology

## 2022-03-24 DIAGNOSIS — N1831 Chronic kidney disease, stage 3a: Secondary | ICD-10-CM | POA: Diagnosis not present

## 2022-03-24 DIAGNOSIS — I10 Essential (primary) hypertension: Secondary | ICD-10-CM | POA: Diagnosis not present

## 2022-03-24 DIAGNOSIS — I5020 Unspecified systolic (congestive) heart failure: Secondary | ICD-10-CM | POA: Diagnosis not present

## 2022-03-24 DIAGNOSIS — E782 Mixed hyperlipidemia: Secondary | ICD-10-CM | POA: Diagnosis not present

## 2022-03-24 DIAGNOSIS — E039 Hypothyroidism, unspecified: Secondary | ICD-10-CM | POA: Diagnosis not present

## 2022-03-24 DIAGNOSIS — E1122 Type 2 diabetes mellitus with diabetic chronic kidney disease: Secondary | ICD-10-CM | POA: Diagnosis not present

## 2022-03-24 MED ORDER — SPIRONOLACTONE 25 MG PO TABS: 12.5000 mg | ORAL_TABLET | Freq: Every day | ORAL | 5 refills | Status: DC

## 2022-03-24 MED ORDER — CARVEDILOL 12.5 MG PO TABS: 25.0000 mg | ORAL_TABLET | Freq: Two times a day (BID) | ORAL | 5 refills | Status: DC

## 2022-03-24 NOTE — Telephone Encounter (Signed)
*  STAT* If patient is at the pharmacy, call can be transferred to refill team.   1. Which medications need to be refilled? (please list name of each medication and dose if known)   spironolactone (ALDACTONE) 25 MG tablet  carvedilol (COREG) 12.5 MG tablet   2. Which pharmacy/location (including street and city if local pharmacy) is medication to be sent to?  [facility] - [place], [place] - [place] [address]   3. Do they need a 30 day or 90 day supply?   30 day   Caller stated patient is out of this medications.

## 2022-03-24 NOTE — Telephone Encounter (Signed)
Pt's medication was sent to pt's pharmacy as requested. Confirmation received.  °

## 2022-04-10 DIAGNOSIS — N1831 Chronic kidney disease, stage 3a: Secondary | ICD-10-CM | POA: Diagnosis not present

## 2022-04-10 DIAGNOSIS — E1122 Type 2 diabetes mellitus with diabetic chronic kidney disease: Secondary | ICD-10-CM | POA: Diagnosis not present

## 2022-04-10 DIAGNOSIS — I10 Essential (primary) hypertension: Secondary | ICD-10-CM | POA: Diagnosis not present

## 2022-04-10 DIAGNOSIS — I5020 Unspecified systolic (congestive) heart failure: Secondary | ICD-10-CM | POA: Diagnosis not present

## 2022-04-10 DIAGNOSIS — E039 Hypothyroidism, unspecified: Secondary | ICD-10-CM | POA: Diagnosis not present

## 2022-04-10 DIAGNOSIS — E782 Mixed hyperlipidemia: Secondary | ICD-10-CM | POA: Diagnosis not present

## 2022-04-10 DIAGNOSIS — J449 Chronic obstructive pulmonary disease, unspecified: Secondary | ICD-10-CM | POA: Diagnosis not present

## 2022-05-13 DIAGNOSIS — E1122 Type 2 diabetes mellitus with diabetic chronic kidney disease: Secondary | ICD-10-CM | POA: Diagnosis not present

## 2022-05-13 DIAGNOSIS — I10 Essential (primary) hypertension: Secondary | ICD-10-CM | POA: Diagnosis not present

## 2022-05-13 DIAGNOSIS — M79671 Pain in right foot: Secondary | ICD-10-CM | POA: Diagnosis not present

## 2022-05-13 DIAGNOSIS — M109 Gout, unspecified: Secondary | ICD-10-CM | POA: Diagnosis not present

## 2022-05-13 DIAGNOSIS — I5020 Unspecified systolic (congestive) heart failure: Secondary | ICD-10-CM | POA: Diagnosis not present

## 2022-05-20 DIAGNOSIS — E1122 Type 2 diabetes mellitus with diabetic chronic kidney disease: Secondary | ICD-10-CM | POA: Diagnosis not present

## 2022-05-20 DIAGNOSIS — E039 Hypothyroidism, unspecified: Secondary | ICD-10-CM | POA: Diagnosis not present

## 2022-05-20 DIAGNOSIS — I5020 Unspecified systolic (congestive) heart failure: Secondary | ICD-10-CM | POA: Diagnosis not present

## 2022-05-20 DIAGNOSIS — N1831 Chronic kidney disease, stage 3a: Secondary | ICD-10-CM | POA: Diagnosis not present

## 2022-05-20 DIAGNOSIS — J449 Chronic obstructive pulmonary disease, unspecified: Secondary | ICD-10-CM | POA: Diagnosis not present

## 2022-05-20 DIAGNOSIS — E113293 Type 2 diabetes mellitus with mild nonproliferative diabetic retinopathy without macular edema, bilateral: Secondary | ICD-10-CM | POA: Diagnosis not present

## 2022-06-10 DIAGNOSIS — N1831 Chronic kidney disease, stage 3a: Secondary | ICD-10-CM | POA: Diagnosis not present

## 2022-06-10 DIAGNOSIS — E782 Mixed hyperlipidemia: Secondary | ICD-10-CM | POA: Diagnosis not present

## 2022-06-10 DIAGNOSIS — I110 Hypertensive heart disease with heart failure: Secondary | ICD-10-CM | POA: Diagnosis not present

## 2022-06-10 DIAGNOSIS — E039 Hypothyroidism, unspecified: Secondary | ICD-10-CM | POA: Diagnosis not present

## 2022-06-10 DIAGNOSIS — N182 Chronic kidney disease, stage 2 (mild): Secondary | ICD-10-CM | POA: Diagnosis not present

## 2022-06-10 DIAGNOSIS — I10 Essential (primary) hypertension: Secondary | ICD-10-CM | POA: Diagnosis not present

## 2022-06-10 DIAGNOSIS — E1122 Type 2 diabetes mellitus with diabetic chronic kidney disease: Secondary | ICD-10-CM | POA: Diagnosis not present

## 2022-06-10 DIAGNOSIS — N400 Enlarged prostate without lower urinary tract symptoms: Secondary | ICD-10-CM | POA: Diagnosis not present

## 2022-06-10 DIAGNOSIS — J449 Chronic obstructive pulmonary disease, unspecified: Secondary | ICD-10-CM | POA: Diagnosis not present

## 2022-07-16 DIAGNOSIS — E1122 Type 2 diabetes mellitus with diabetic chronic kidney disease: Secondary | ICD-10-CM | POA: Diagnosis not present

## 2022-07-16 DIAGNOSIS — E782 Mixed hyperlipidemia: Secondary | ICD-10-CM | POA: Diagnosis not present

## 2022-07-16 DIAGNOSIS — N1831 Chronic kidney disease, stage 3a: Secondary | ICD-10-CM | POA: Diagnosis not present

## 2022-07-16 DIAGNOSIS — N400 Enlarged prostate without lower urinary tract symptoms: Secondary | ICD-10-CM | POA: Diagnosis not present

## 2022-07-16 DIAGNOSIS — I10 Essential (primary) hypertension: Secondary | ICD-10-CM | POA: Diagnosis not present

## 2022-07-16 DIAGNOSIS — E039 Hypothyroidism, unspecified: Secondary | ICD-10-CM | POA: Diagnosis not present

## 2022-07-16 DIAGNOSIS — I110 Hypertensive heart disease with heart failure: Secondary | ICD-10-CM | POA: Diagnosis not present

## 2022-07-16 DIAGNOSIS — J449 Chronic obstructive pulmonary disease, unspecified: Secondary | ICD-10-CM | POA: Diagnosis not present

## 2022-08-12 DIAGNOSIS — I110 Hypertensive heart disease with heart failure: Secondary | ICD-10-CM | POA: Diagnosis not present

## 2022-08-12 DIAGNOSIS — E782 Mixed hyperlipidemia: Secondary | ICD-10-CM | POA: Diagnosis not present

## 2022-08-12 DIAGNOSIS — N400 Enlarged prostate without lower urinary tract symptoms: Secondary | ICD-10-CM | POA: Diagnosis not present

## 2022-08-12 DIAGNOSIS — I10 Essential (primary) hypertension: Secondary | ICD-10-CM | POA: Diagnosis not present

## 2022-08-12 DIAGNOSIS — N182 Chronic kidney disease, stage 2 (mild): Secondary | ICD-10-CM | POA: Diagnosis not present

## 2022-08-12 DIAGNOSIS — E039 Hypothyroidism, unspecified: Secondary | ICD-10-CM | POA: Diagnosis not present

## 2022-08-12 DIAGNOSIS — E1122 Type 2 diabetes mellitus with diabetic chronic kidney disease: Secondary | ICD-10-CM | POA: Diagnosis not present

## 2022-08-12 DIAGNOSIS — J449 Chronic obstructive pulmonary disease, unspecified: Secondary | ICD-10-CM | POA: Diagnosis not present

## 2022-09-08 ENCOUNTER — Other Ambulatory Visit: Payer: Self-pay | Admitting: *Deleted

## 2022-09-08 MED ORDER — SPIRONOLACTONE 25 MG PO TABS: 12.5000 mg | ORAL_TABLET | Freq: Every day | ORAL | 1 refills | Status: DC

## 2022-09-17 DIAGNOSIS — E46 Unspecified protein-calorie malnutrition: Secondary | ICD-10-CM | POA: Diagnosis not present

## 2022-09-17 DIAGNOSIS — I110 Hypertensive heart disease with heart failure: Secondary | ICD-10-CM | POA: Diagnosis not present

## 2022-09-17 DIAGNOSIS — E1122 Type 2 diabetes mellitus with diabetic chronic kidney disease: Secondary | ICD-10-CM | POA: Diagnosis not present

## 2022-09-17 DIAGNOSIS — E782 Mixed hyperlipidemia: Secondary | ICD-10-CM | POA: Diagnosis not present

## 2022-09-17 DIAGNOSIS — N400 Enlarged prostate without lower urinary tract symptoms: Secondary | ICD-10-CM | POA: Diagnosis not present

## 2022-09-17 DIAGNOSIS — N1831 Chronic kidney disease, stage 3a: Secondary | ICD-10-CM | POA: Diagnosis not present

## 2022-09-17 DIAGNOSIS — I4510 Unspecified right bundle-branch block: Secondary | ICD-10-CM | POA: Diagnosis not present

## 2022-09-17 DIAGNOSIS — I10 Essential (primary) hypertension: Secondary | ICD-10-CM | POA: Diagnosis not present

## 2022-09-17 DIAGNOSIS — J449 Chronic obstructive pulmonary disease, unspecified: Secondary | ICD-10-CM | POA: Diagnosis not present

## 2022-09-17 DIAGNOSIS — E039 Hypothyroidism, unspecified: Secondary | ICD-10-CM | POA: Diagnosis not present

## 2022-09-17 DIAGNOSIS — E113293 Type 2 diabetes mellitus with mild nonproliferative diabetic retinopathy without macular edema, bilateral: Secondary | ICD-10-CM | POA: Diagnosis not present

## 2022-09-17 DIAGNOSIS — Z1331 Encounter for screening for depression: Secondary | ICD-10-CM | POA: Diagnosis not present

## 2022-09-17 DIAGNOSIS — Z0000 Encounter for general adult medical examination without abnormal findings: Secondary | ICD-10-CM | POA: Diagnosis not present

## 2022-09-17 DIAGNOSIS — I5022 Chronic systolic (congestive) heart failure: Secondary | ICD-10-CM | POA: Diagnosis not present

## 2022-10-08 ENCOUNTER — Other Ambulatory Visit: Payer: Self-pay

## 2022-10-08 DIAGNOSIS — N1831 Chronic kidney disease, stage 3a: Secondary | ICD-10-CM | POA: Diagnosis not present

## 2022-10-08 MED ORDER — CARVEDILOL 12.5 MG PO TABS: 25.0000 mg | ORAL_TABLET | Freq: Two times a day (BID) | ORAL | 0 refills | Status: DC

## 2022-10-08 MED ORDER — AMIODARONE HCL 100 MG PO TABS: 100.0000 mg | ORAL_TABLET | Freq: Every day | ORAL | 0 refills | Status: DC

## 2022-10-09 DIAGNOSIS — I5020 Unspecified systolic (congestive) heart failure: Secondary | ICD-10-CM | POA: Diagnosis not present

## 2022-10-09 DIAGNOSIS — N400 Enlarged prostate without lower urinary tract symptoms: Secondary | ICD-10-CM | POA: Diagnosis not present

## 2022-10-09 DIAGNOSIS — I10 Essential (primary) hypertension: Secondary | ICD-10-CM | POA: Diagnosis not present

## 2022-10-09 DIAGNOSIS — E039 Hypothyroidism, unspecified: Secondary | ICD-10-CM | POA: Diagnosis not present

## 2022-10-09 DIAGNOSIS — E782 Mixed hyperlipidemia: Secondary | ICD-10-CM | POA: Diagnosis not present

## 2022-10-09 DIAGNOSIS — E1122 Type 2 diabetes mellitus with diabetic chronic kidney disease: Secondary | ICD-10-CM | POA: Diagnosis not present

## 2022-10-09 DIAGNOSIS — N1831 Chronic kidney disease, stage 3a: Secondary | ICD-10-CM | POA: Diagnosis not present

## 2022-10-09 DIAGNOSIS — I110 Hypertensive heart disease with heart failure: Secondary | ICD-10-CM | POA: Diagnosis not present

## 2022-10-09 DIAGNOSIS — J449 Chronic obstructive pulmonary disease, unspecified: Secondary | ICD-10-CM | POA: Diagnosis not present

## 2022-10-15 ENCOUNTER — Other Ambulatory Visit: Payer: Self-pay | Admitting: Cardiology

## 2022-10-15 MED ORDER — ENTRESTO 49-51 MG PO TABS: 1.0000 | ORAL_TABLET | Freq: Two times a day (BID) | ORAL | 0 refills | Status: DC

## 2022-10-26 DIAGNOSIS — I959 Hypotension, unspecified: Secondary | ICD-10-CM | POA: Diagnosis not present

## 2022-10-26 DIAGNOSIS — M79604 Pain in right leg: Secondary | ICD-10-CM | POA: Diagnosis not present

## 2022-11-04 DIAGNOSIS — M79604 Pain in right leg: Secondary | ICD-10-CM | POA: Diagnosis not present

## 2022-11-09 ENCOUNTER — Other Ambulatory Visit: Payer: Self-pay | Admitting: Nurse Practitioner

## 2022-11-09 ENCOUNTER — Other Ambulatory Visit: Payer: Self-pay | Admitting: Physician Assistant

## 2022-11-09 ENCOUNTER — Other Ambulatory Visit: Payer: Self-pay | Admitting: Cardiology

## 2022-11-09 NOTE — Telephone Encounter (Signed)
Rx(s) sent to pharmacy electronically.  

## 2022-11-12 ENCOUNTER — Other Ambulatory Visit: Payer: Self-pay

## 2022-11-12 MED ORDER — ENTRESTO 49-51 MG PO TABS: 1.0000 | ORAL_TABLET | Freq: Two times a day (BID) | ORAL | 0 refills | Status: DC

## 2022-11-15 ENCOUNTER — Encounter: Payer: Self-pay | Admitting: Physician Assistant
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
## 2022-11-16 ENCOUNTER — Encounter: Payer: Self-pay | Admitting: Physician Assistant

## 2022-11-16 ENCOUNTER — Ambulatory Visit: Admission: RE | Admit: 2022-11-16 | Payer: Medicare HMO | Source: Ambulatory Visit

## 2022-11-16 ENCOUNTER — Ambulatory Visit: Payer: Medicare HMO | Attending: Physician Assistant | Admitting: Physician Assistant

## 2022-11-16 VITALS — BP 120/60 | HR 70 | Ht 68.0 in | Wt 137.6 lb

## 2022-11-16 DIAGNOSIS — I452 Bifascicular block: Secondary | ICD-10-CM

## 2022-11-16 DIAGNOSIS — I5022 Chronic systolic (congestive) heart failure: Secondary | ICD-10-CM

## 2022-11-16 DIAGNOSIS — M7989 Other specified soft tissue disorders: Secondary | ICD-10-CM | POA: Diagnosis not present

## 2022-11-16 DIAGNOSIS — N1832 Chronic kidney disease, stage 3b: Secondary | ICD-10-CM | POA: Diagnosis not present

## 2022-11-16 DIAGNOSIS — I77810 Thoracic aortic ectasia: Secondary | ICD-10-CM | POA: Diagnosis not present

## 2022-11-16 DIAGNOSIS — L0390 Cellulitis, unspecified: Secondary | ICD-10-CM | POA: Diagnosis not present

## 2022-11-16 DIAGNOSIS — Z79899 Other long term (current) drug therapy: Secondary | ICD-10-CM

## 2022-11-16 DIAGNOSIS — I493 Ventricular premature depolarization: Secondary | ICD-10-CM

## 2022-11-16 DIAGNOSIS — R600 Localized edema: Secondary | ICD-10-CM

## 2022-11-16 DIAGNOSIS — I10 Essential (primary) hypertension: Secondary | ICD-10-CM | POA: Diagnosis not present

## 2022-11-16 DIAGNOSIS — R918 Other nonspecific abnormal finding of lung field: Secondary | ICD-10-CM | POA: Diagnosis not present

## 2022-11-16 MED ORDER — FUROSEMIDE 20 MG PO TABS
ORAL_TABLET | ORAL | Status: AC
Start: 2022-11-16 — End: ?

## 2022-11-16 NOTE — Patient Instructions (Signed)
Medication Instructions:  Your physician has recommended you make the following change in your medication: INCREASE Lasix 20mg  take 2 tablets daily for 3 days then reduce to 1 tablet daily.  *If you need a refill on your cardiac medications before your next appointment, please call your pharmacy*   Lab Work: CBC, CMET, and TSH with RFX.   If you have labs (blood work) drawn today and your tests are completely normal, you will receive your results only by: MyChart Message (if you have MyChart) OR A paper copy in the mail If you have any lab test that is abnormal or we need to change your treatment, we will call you to review the results.   Testing/Procedures: Your physician has requested that you have an echocardiogram. Echocardiography is a painless test that uses sound waves to create images of your heart. It provides your doctor with information about the size and shape of your heart and how well your heart's chambers and valves are working. This procedure takes approximately one hour. There are no restrictions for this procedure. Please do NOT wear cologne, aftershave, or lotions (deodorant is allowed). Please arrive 15 minutes prior to your appointment time.  A chest x-ray takes a picture of the organs and structures inside the chest, including the heart, lungs, and blood vessels. This test can show several things, including, whether the heart is enlarges; whether fluid is building up in the lungs; and whether pacemaker / defibrillator leads are still in place.  Go to [facility] [address] Ginette Otto [phone]    Follow-Up: At [facility], you and your health needs are our priority.  As part of our continuing mission to provide you with exceptional heart care, we have created designated Provider Care Teams.  These Care Teams include your primary Cardiologist (physician) and Advanced Practice Providers (APPs -  Physician Assistants and Nurse  Practitioners) who all work together to provide you with the care you need, when you need it.  We recommend signing up for the patient portal called "MyChart".  Sign up information is provided on this After Visit Summary.  MyChart is used to connect with patients for Virtual Visits (Telemedicine).  Patients are able to view lab/test results, encounter notes, upcoming appointments, etc.  Non-urgent messages can be sent to your provider as well.   To learn more about what you can do with MyChart, go to [email or web address].    Your next appointment:   6 week(s)  Provider:   Ronie Spies, PA-C

## 2022-11-17 ENCOUNTER — Other Ambulatory Visit: Payer: Self-pay

## 2022-11-17 ENCOUNTER — Telehealth: Payer: Self-pay | Admitting: Physician Assistant

## 2022-11-17 LAB — CBC
Hematocrit: 39.3 % (ref 37.5–51.0)
Hemoglobin: 12.7 g/dL — ABNORMAL LOW (ref 13.0–17.7)
MCH: 26.6 pg (ref 26.6–33.0)
MCHC: 32.3 g/dL (ref 31.5–35.7)
MCV: 82 fL (ref 79–97)
Platelets: 358 10*3/uL (ref 150–450)
RBC: 4.77 x10E6/uL (ref 4.14–5.80)
RDW: 14.6 % (ref 11.6–15.4)
WBC: 7.3 10*3/uL (ref 3.4–10.8)

## 2022-11-17 LAB — COMPREHENSIVE METABOLIC PANEL
ALT: 5 IU/L (ref 0–44)
AST: 7 IU/L (ref 0–40)
Albumin: 3.3 g/dL — ABNORMAL LOW (ref 3.7–4.7)
Alkaline Phosphatase: 92 IU/L (ref 44–121)
BUN/Creatinine Ratio: 12 (ref 10–24)
BUN: 18 mg/dL (ref 8–27)
Bilirubin Total: 0.4 mg/dL (ref 0.0–1.2)
CO2: 22 mmol/L (ref 20–29)
Calcium: 8.9 mg/dL (ref 8.6–10.2)
Chloride: 100 mmol/L (ref 96–106)
Creatinine, Ser: 1.56 mg/dL — ABNORMAL HIGH (ref 0.76–1.27)
Globulin, Total: 3.4 g/dL (ref 1.5–4.5)
Glucose: 117 mg/dL — ABNORMAL HIGH (ref 70–99)
Potassium: 5.2 mmol/L (ref 3.5–5.2)
Sodium: 137 mmol/L (ref 134–144)
Total Protein: 6.7 g/dL (ref 6.0–8.5)
eGFR: 43 mL/min/{1.73_m2} — ABNORMAL LOW (ref >59)

## 2022-11-17 LAB — TSH RFX ON ABNORMAL TO FREE T4: TSH: 2.37 u[IU]/mL (ref 0.450–4.500)

## 2022-11-17 NOTE — Telephone Encounter (Signed)
Left a message for the pts son...will route note to his PCP Dr Donette Larry and I sent labs to Dr Donette Larry for his review.

## 2022-11-17 NOTE — Telephone Encounter (Signed)
Pt c/o medication issue:  1. Name of Medication: spironolactone (ALDACTONE) 25 MG tablet   2. How are you currently taking this medication (dosage and times per day)? spironolactone (ALDACTONE) 25 MG tablet   3. Are you having a reaction (difficulty breathing--STAT)? No  4. What is your medication issue? Patients friend is calling because PCP wants patient to take a full 25 MG of spironolactione. Would like to get 2nd opinion from cardiologist to see what they say.

## 2022-11-17 NOTE — Telephone Encounter (Signed)
I would not suggest increasing spironolactone due to risk for hyperkalemia. Would relay this to PCP and patient/son - when we checked labs yesterday, his potassium was already at the very tip top of normal (5.2) which they might not have been aware of. It was 5.1 in June so I think this is an accurate result. With his kidney disease, he is at risk for developing hyperkalemia if he increases the spironolactone further. Yesterday I had recommended increasing Lasix to 40mg  daily x 3 days then back to 20mg  daily. I do not think this will increase his potassium level. We also need input from the PCP what his LE ultrasound showed last week (the patient and son were waiting for results and planned to discuss with him yesterday).  I agree with him seeing PCP back in 2 weeks -grateful for their care. Otherwise we'll see again as scheduled in October, sooner if needed.

## 2022-11-17 NOTE — Telephone Encounter (Signed)
I spoke with Gary Morse and advised him Kriste Basque Dunn's recommendations based on his labwork from yesterday and he reports that he will not make any changes yet until he hears back from Dr Donette Larry... I advised that I sent her note and labs to his office but he may need to reach out to Dr [place] office.. he says he will call if he does not hear back from them later today.

## 2022-11-17 NOTE — Telephone Encounter (Signed)
I spoke with the pts sone, [name], since the called was not listed on the [facility].... he says he has been with the pt for both of this appt yesterday with Ronie Spies and his PCP DR Donette Larry and he saw Dr Donette Larry after his cardio appt.... Dr Donette Larry changed his plan to treating his lower extremities with antibiotics and only increasing his Spironolactone to a full tab daily and seeing him back in 2 weeks and they plan to follow his instructions for now and will l;et su know if he needs [place].

## 2022-12-07 ENCOUNTER — Encounter (HOSPITAL_COMMUNITY): Payer: Self-pay | Admitting: Physician Assistant

## 2022-12-07 ENCOUNTER — Ambulatory Visit (HOSPITAL_COMMUNITY): Payer: Medicare HMO | Attending: Physician Assistant

## 2022-12-08 DIAGNOSIS — L0390 Cellulitis, unspecified: Secondary | ICD-10-CM | POA: Diagnosis not present

## 2022-12-08 DIAGNOSIS — M79606 Pain in leg, unspecified: Secondary | ICD-10-CM | POA: Diagnosis not present

## 2022-12-14 ENCOUNTER — Encounter: Payer: Self-pay | Admitting: Orthopedic Surgery

## 2022-12-14 ENCOUNTER — Ambulatory Visit (INDEPENDENT_AMBULATORY_CARE_PROVIDER_SITE_OTHER): Payer: Medicare HMO | Admitting: Orthopedic Surgery

## 2022-12-14 ENCOUNTER — Other Ambulatory Visit (INDEPENDENT_AMBULATORY_CARE_PROVIDER_SITE_OTHER): Payer: Medicare HMO

## 2022-12-14 DIAGNOSIS — L02415 Cutaneous abscess of right lower limb: Secondary | ICD-10-CM

## 2022-12-14 DIAGNOSIS — M86661 Other chronic osteomyelitis, right tibia and fibula: Secondary | ICD-10-CM | POA: Diagnosis not present

## 2022-12-14 DIAGNOSIS — M79661 Pain in right lower leg: Secondary | ICD-10-CM | POA: Diagnosis not present
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
## 2022-12-15 ENCOUNTER — Encounter: Payer: Self-pay | Admitting: Internal Medicine

## 2022-12-15 ENCOUNTER — Ambulatory Visit: Payer: Medicare HMO | Attending: Internal Medicine | Admitting: Internal Medicine

## 2022-12-17 ENCOUNTER — Other Ambulatory Visit: Payer: Self-pay | Admitting: Cardiology

## 2022-12-17 ENCOUNTER — Other Ambulatory Visit: Payer: Self-pay

## 2022-12-17 MED ORDER — POTASSIUM CHLORIDE CRYS ER 20 MEQ PO TBCR: 20.0000 meq | EXTENDED_RELEASE_TABLET | Freq: Every morning | ORAL | 3 refills | Status: DC

## 2022-12-17 MED ORDER — ENTRESTO 49-51 MG PO TABS: 1.0000 | ORAL_TABLET | Freq: Two times a day (BID) | ORAL | 3 refills | Status: DC

## 2022-12-17 NOTE — Addendum Note (Signed)
Addended by: Margaret Pyle D on: 12/17/2022 02:21 PM   Modules accepted: Orders

## 2022-12-19 ENCOUNTER — Ambulatory Visit
Admission: RE | Admit: 2022-12-19 | Discharge: 2022-12-19 | Disposition: A | Discharge status: Home / Self Care | Payer: Medicare HMO | Source: Ambulatory Visit | Attending: Orthopedic Surgery | Admitting: Orthopedic Surgery

## 2022-12-19 DIAGNOSIS — M86661 Other chronic osteomyelitis, right tibia and fibula: Secondary | ICD-10-CM

## 2022-12-19 DIAGNOSIS — L02415 Cutaneous abscess of right lower limb: Secondary | ICD-10-CM

## 2022-12-19 DIAGNOSIS — M869 Osteomyelitis, unspecified: Secondary | ICD-10-CM | POA: Diagnosis not present

## 2022-12-19 DIAGNOSIS — L03116 Cellulitis of left lower limb: Secondary | ICD-10-CM | POA: Diagnosis not present

## 2022-12-19 MED ORDER — GADOPICLENOL 0.5 MMOL/ML IV SOLN: 7.0000 mL | Freq: Once | INTRAVENOUS | Status: DC | PRN

## 2022-12-20 LAB — ANAEROBIC AND AEROBIC CULTURE
MICRO NUMBER:: 15349278
MICRO NUMBER:: 15349279
SPECIMEN QUALITY:: ADEQUATE
SPECIMEN QUALITY:: ADEQUATE

## 2022-12-22 ENCOUNTER — Ambulatory Visit: Payer: Medicare HMO | Admitting: Orthopaedic Surgery

## 2022-12-24 ENCOUNTER — Other Ambulatory Visit: Payer: Self-pay | Admitting: Cardiology

## 2022-12-25 ENCOUNTER — Telehealth: Payer: Self-pay

## 2022-12-25 NOTE — Telephone Encounter (Signed)
-----   Message from Nadara Mustard sent at 12/25/2022 12:48 PM EDT ----- Gary Morse make sure this patient is on the schedule for follow-up next week.  Thank you ----- Message ----- From: Janace Hoard Lab Results In Sent: 12/20/2022   8:06 AM EDT To: Nadara Mustard, MD

## 2022-12-25 NOTE — Telephone Encounter (Signed)
Tried pt again and line is busy. Will hold and try again.

## 2022-12-25 NOTE — Telephone Encounter (Signed)
I tried to call pt and make an appt for Tuesday at 10:30 no answer or vm. Will hold message and try to call again.

## 2022-12-30 NOTE — Telephone Encounter (Signed)
I called and pt's line remains bust. I looked at the [facility] and pt's wife Gary Morse is authorized to call but phone rings without answer. Pt's Son Gary Morse is also listed on release I called [phone] and was able to leave a message and advise that we are looking to make an appt for the pt and would like for them to call back so that we can get him on the sch. Could make an appt for tomorrow or early next week. Will continue to hold this message and reach out to the family again.

## 2023-01-05 NOTE — Telephone Encounter (Signed)
Can you please try to reach out to the pt and his wife to set up appt with Dr. Lajoyce Corners at the next available please?

## 2023-01-25 NOTE — Progress Notes (Deleted)
Cardiology Office Note    Date:  01/25/2023  ID:  Gary Morse, DOB 1935-01-25, MRN [id number] PCP:  Georgann Housekeeper, MD  Cardiologist:  Lesleigh Noe, MD (Inactive)  Electrophysiologist:  Lewayne Bunting, MD   Chief Complaint: ***  History of Present Illness: .    Gary Morse is a 88 y.o. 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   Per notes, he had longstanding h/o diastolic CHF but established care in 2019 with Dr. Katrinka Blazing for recurrent issues. He was found to have EF 25-30% and frequent PVCs (monitor with 18% PVCs with up to 5 beat runs NSVT, rare PACs, min HR 54-max 130). Dr. Katrinka Blazing referenced an ischemic workup years prior that was negative but he did not have subsequent ischemic workup for this decline. Etiology felt possibly related to HTN or frequent PVCs, treated with amiodarone. Last echo 11/2021 showed EF 35-40% with Global hypokinesis with severe hypokinesis of the inferolateral wall, G1DD, normal RV, mild LAE, mild asc aorta dilation. I saw him in clinic 10/2022 after a long hiatus of being seen (had last been seen during 11/2021 admission for respiratory failure and suspected a/c HFrEF prompting uptitration of GDMT). At OV he was having issues with worsening RLE edema and had had reported LE venous duplex by PCP. He had been losing a lot of weight the last year. I had discussed case with Dr. Clifton James and we both agreed that medical management would be most appropriate, rather than pursuit of invasive ischemic evaluation. I ordered echo which has not yet been done.  Chronic HFrEF Frequent PVCs, NSVT, PACs RBBB+LAFB CKD 3b with recent borderline hyperkalemia Mild ascending aorta dilation   Labwork independently reviewed: 10/2022 TSH wnl, K 5.2, Cr 1.56, alb 3.3, AST ALT OK, Hgb 12.7, plt 358 KPN 09/2022 K 5.1, Cr 1.79 KPN 08/2022 LDL 64,  trig 126, A1c 7.1, Hgb 13.8 11/2021 Cr 1.59, K 3.6. Ft4/T3 abnl, TSH 4.9, trop low/flat, Mg 2.7, AST 50, ALT wnl, alb 3.2   ROS: .    Please see the history of present illness. Otherwise, review of systems is positive for ***.  All other systems are reviewed and otherwise negative.  Studies Reviewed: Marland Kitchen    EKG:  EKG is ordered today, personally reviewed, demonstrating ***  CV Studies: Cardiac studies reviewed are outlined and summarized above. Otherwise please see EMR for full report.   Current Reported Medications:.    No outpatient medications have been marked as taking for the 01/26/23 encounter (Appointment) with Laurann Montana, PA-C.    Physical Exam:    VS:  There were no vitals taken for this visit.   Wt Readings from Last 3 Encounters:  11/16/22 137 lb 9.6 oz (62.4 kg)  12/21/21 169 lb 5 oz (76.8 kg)  10/06/21 172 lb 6.4 oz (78.2 kg)    GEN: Well nourished, well developed in no acute distress NECK: No JVD; No carotid bruits CARDIAC: ***RRR, no murmurs, rubs, gallops RESPIRATORY:  Clear to auscultation without rales, wheezing or rhonchi  ABDOMEN: Soft, non-tender, non-distended EXTREMITIES:  No edema; No acute deformity   Asessement and Plan:.     ***     Disposition: F/u with ***  Signed, Laurann Montana, PA-C

## 2023-01-26 ENCOUNTER — Ambulatory Visit: Payer: Medicare HMO | Attending: Physician Assistant | Admitting: Physician Assistant

## 2023-01-26 DIAGNOSIS — I493 Ventricular premature depolarization: Secondary | ICD-10-CM

## 2023-01-26 DIAGNOSIS — I491 Atrial premature depolarization: Secondary | ICD-10-CM

## 2023-01-26 DIAGNOSIS — N1832 Chronic kidney disease, stage 3b: Secondary | ICD-10-CM

## 2023-01-26 DIAGNOSIS — I452 Bifascicular block: Secondary | ICD-10-CM

## 2023-01-26 DIAGNOSIS — I77810 Thoracic aortic ectasia: Secondary | ICD-10-CM

## 2023-01-26 DIAGNOSIS — I4729 Other ventricular tachycardia: Secondary | ICD-10-CM

## 2023-01-26 DIAGNOSIS — I5022 Chronic systolic (congestive) heart failure: Secondary | ICD-10-CM

## 2023-01-27 ENCOUNTER — Encounter: Payer: Self-pay | Admitting: Physician Assistant

## 2023-03-03 ENCOUNTER — Ambulatory Visit: Payer: Medicare HMO | Attending: Internal Medicine | Admitting: Internal Medicine

## 2023-03-04 ENCOUNTER — Encounter: Payer: Self-pay | Admitting: Internal Medicine

## 2023-04-11 DIAGNOSIS — M109 Gout, unspecified: Secondary | ICD-10-CM | POA: Diagnosis not present

## 2023-04-11 DIAGNOSIS — M545 Low back pain, unspecified: Secondary | ICD-10-CM | POA: Diagnosis not present

## 2023-04-11 DIAGNOSIS — E785 Hyperlipidemia, unspecified: Secondary | ICD-10-CM | POA: Diagnosis not present

## 2023-04-11 DIAGNOSIS — G3184 Mild cognitive impairment, so stated: Secondary | ICD-10-CM | POA: Diagnosis not present

## 2023-04-11 DIAGNOSIS — N529 Male erectile dysfunction, unspecified: Secondary | ICD-10-CM | POA: Diagnosis not present

## 2023-04-11 DIAGNOSIS — I4891 Unspecified atrial fibrillation: Secondary | ICD-10-CM | POA: Diagnosis not present

## 2023-04-11 DIAGNOSIS — M1990 Unspecified osteoarthritis, unspecified site: Secondary | ICD-10-CM | POA: Diagnosis not present

## 2023-04-11 DIAGNOSIS — E1142 Type 2 diabetes mellitus with diabetic polyneuropathy: Secondary | ICD-10-CM | POA: Diagnosis not present

## 2023-04-11 DIAGNOSIS — J4489 Other specified chronic obstructive pulmonary disease: Secondary | ICD-10-CM | POA: Diagnosis not present

## 2023-04-11 DIAGNOSIS — N400 Enlarged prostate without lower urinary tract symptoms: Secondary | ICD-10-CM | POA: Diagnosis not present

## 2023-04-11 DIAGNOSIS — E039 Hypothyroidism, unspecified: Secondary | ICD-10-CM | POA: Diagnosis not present

## 2023-04-11 DIAGNOSIS — G4700 Insomnia, unspecified: Secondary | ICD-10-CM | POA: Diagnosis not present

## 2023-09-23 DIAGNOSIS — E119 Type 2 diabetes mellitus without complications: Secondary | ICD-10-CM | POA: Diagnosis not present

## 2023-09-23 DIAGNOSIS — E039 Hypothyroidism, unspecified: Secondary | ICD-10-CM | POA: Diagnosis not present

## 2023-09-23 DIAGNOSIS — E113293 Type 2 diabetes mellitus with mild nonproliferative diabetic retinopathy without macular edema, bilateral: Secondary | ICD-10-CM | POA: Diagnosis not present

## 2023-09-23 DIAGNOSIS — J449 Chronic obstructive pulmonary disease, unspecified: Secondary | ICD-10-CM | POA: Diagnosis not present

## 2023-09-23 DIAGNOSIS — E1122 Type 2 diabetes mellitus with diabetic chronic kidney disease: Secondary | ICD-10-CM | POA: Diagnosis not present

## 2023-09-23 DIAGNOSIS — Z0000 Encounter for general adult medical examination without abnormal findings: Secondary | ICD-10-CM | POA: Diagnosis not present

## 2023-09-23 DIAGNOSIS — N1831 Chronic kidney disease, stage 3a: Secondary | ICD-10-CM | POA: Diagnosis not present

## 2023-09-23 DIAGNOSIS — E46 Unspecified protein-calorie malnutrition: Secondary | ICD-10-CM | POA: Diagnosis not present

## 2023-09-23 DIAGNOSIS — E782 Mixed hyperlipidemia: Secondary | ICD-10-CM | POA: Diagnosis not present

## 2023-09-23 DIAGNOSIS — I10 Essential (primary) hypertension: Secondary | ICD-10-CM | POA: Diagnosis not present

## 2023-09-23 DIAGNOSIS — I5020 Unspecified systolic (congestive) heart failure: Secondary | ICD-10-CM | POA: Diagnosis not present

## 2023-09-27 ENCOUNTER — Encounter: Payer: Self-pay | Admitting: Physician Assistant

## 2023-10-26 ENCOUNTER — Other Ambulatory Visit: Payer: Self-pay

## 2023-10-26 ENCOUNTER — Telehealth: Payer: Self-pay | Admitting: Pharmacy Technician

## 2023-10-26 MED ORDER — AMIODARONE HCL 200 MG PO TABS: 100.0000 mg | ORAL_TABLET | Freq: Every day | ORAL | 0 refills | Status: DC

## 2023-10-26 MED ORDER — AMIODARONE HCL 100 MG PO TABS: 100.0000 mg | ORAL_TABLET | Freq: Every day | ORAL | 0 refills | Status: DC

## 2023-10-26 NOTE — Telephone Encounter (Signed)
 Called patient. Spoke w his son (DPR). Adv of change of amiodarone  tablet based on insurance company request.   Aware that he will need 1/2 tablet daily from new bottle.  Prescription sent.  Adv a follow up appointment is needed.  Since the patient is not available at the moment, I will send the message to scheduling to reach out and schedule patient.

## 2023-10-26 NOTE — Telephone Encounter (Signed)
 Hi, Insurance is asking if the amiodarone  can be changed to the 200mg  tablet instead?

## 2023-10-27 ENCOUNTER — Encounter: Payer: Self-pay | Admitting: Physician Assistant

## 2023-11-01 ENCOUNTER — Ambulatory Visit: Attending: Physician Assistant | Admitting: Physician Assistant

## 2023-11-01 ENCOUNTER — Encounter: Payer: Self-pay | Admitting: Physician Assistant

## 2023-11-01 VITALS — BP 150/74 | HR 85 | Ht 68.0 in | Wt 149.6 lb

## 2023-11-01 DIAGNOSIS — I5022 Chronic systolic (congestive) heart failure: Secondary | ICD-10-CM | POA: Diagnosis not present

## 2023-11-01 DIAGNOSIS — I493 Ventricular premature depolarization: Secondary | ICD-10-CM | POA: Diagnosis not present

## 2023-11-01 DIAGNOSIS — M869 Osteomyelitis, unspecified: Secondary | ICD-10-CM | POA: Diagnosis not present

## 2023-11-01 NOTE — Patient Instructions (Signed)
 Medication Instructions:  NO CHANGES *If you need a refill on your cardiac medications before your next appointment, please call your pharmacy*  Lab Work: NO LABS If you have labs (blood work) drawn today and your tests are completely normal, you will receive your results only by: MyChart Message (if you have MyChart) OR A paper copy in the mail If you have any lab test that is abnormal or we need to change your treatment, we will call you to review the results.  Testing/Procedures:[address] Your physician has requested that you have an echocardiogram. Echocardiography is a painless test that uses sound waves to create images of your heart. It provides your doctor with information about the size and shape of your heart and how well your heart's chambers and valves are working. This procedure takes approximately one hour. There are no restrictions for this procedure. Please do NOT wear cologne, perfume, aftershave, or lotions (deodorant is allowed). Please arrive 15 minutes prior to your appointment time.  Please note: We ask at that you not bring children with you during ultrasound (echo/ vascular) testing. Due to room size and safety concerns, children are not allowed in the ultrasound rooms during exams. Our front office staff cannot provide observation of children in our lobby area while testing is being conducted. An adult accompanying a patient to their appointment will only be allowed in the ultrasound room at the discretion of the ultrasound technician under special circumstances. We apologize for any inconvenience.   Follow-Up: At [facility], you and your health needs are our priority.  As part of our continuing mission to provide you with exceptional heart care, our providers are all part of one team.  This team includes your primary Cardiologist (physician) and Advanced Practice Providers or APPs (Physician Assistants and Nurse Practitioners) who all work together to  provide you with the care you need, when you need it.  Your next appointment:   3 month(s)  Provider:   Oneil Parchment, MD

## 2023-11-01 NOTE — Progress Notes (Unsigned)
 Cardiology Office Note   Date:  11/03/2023  ID:  Gary Morse, DOB 1935/03/31, MRN [id number] PCP: Gary Other, MD  [facility] Providers Cardiologist:  Gary Parchment, MD Electrophysiologist:  Gary Birmingham, MD     History of Present Illness Gary Morse is a 89 y.o. male with past medical history of chronic HFrEF, frequent PVCs on amiodarone , SVT, PACs, mild dilatation of the ascending aorta, RBBB+LAFB, CKD stage IIIb, hyperlipidemia and DM2.  He had a longstanding history of diastolic heart failure however established care here in 2019 with Dr. Claudene due to recurrent issue.  He was found to have EF of 25 to 30% the time with frequent PVCs.  Heart monitor showed 18% PVC burden with up to 5 beats run of nonsustained VT and rare PACs.  Dr. Claudene referenced ischemic workup years prior that was negative but he did not have subsequent ischemic workup for this decline.  Etiology was felt to be related to hypotension in the frequent PVCs.  He was treated with amiodarone  therapy.  Echocardiogram in August 2023 showed EF 35 to 40%, global hypokinesis with severe hypokinesis of the inferolateral wall, grade 1 DD, mild ascending aortic dilatation.  He was last seen by Gary Bring, PA-C on 11/16/2022 for mild right lower extremity edema.  Patient was instructed to increase Lasix  to 40 mg daily for 3 days before backing down to 20 mg daily thereafter.  His case was discussed with Dr. Verlin at the time, it was felt conservative management was appropriate given his frailty, age and the CKD, given this workup was deferred as risk would outweigh the potential benefit.  Blood work obtained at the time showed a hemoglobin of 12.7.  Creatinine of 1 0.56.  Normal TSH.  Patchy lingular airspace disease on chest x-ray.  Echocardiogram was ordered however never completed.  Patient was instructed to follow-up in 6 weeks patient was instructed to follow-up in 6 weeks, however failed to follow-up since.  He was seen  by orthopedic service in August 2024.  X-ray of right distal leg showed extensive osteomyelitis with a large elongated Brodie's abscess involving almost the entire imaged tibia, soft tissue abscess involving the tibia adjacent to the bony defect and the wraparound tibia.  Abscess culture grew Staphylococcus aureus.  Per patient, he has some necrosis of like tissue last year after a spider bite.  He never followed up with Dr. Crist office despite multiple phone calls.  The son who was previously taking care of his medical need unfortunately passed away last year.  One of his Morse son is taking over the care.  He did not take his blood pressure medications this morning.  His blood pressure was 150/76, even on manual repeat by myself it was still 150/80.  Since he did not take any of his medications this morning, I did not try to titrate his medication.  He was seen by Dr. Lunette his PCP in May, blood work at the time showed well-controlled cholesterol, stable renal function with a creatinine 1.6.  Normal TSH.  His lung is clear.  He has no lower extremity edema.  His right lower extremity is obviously scarred but better than I expected given the previous finding of osteomyelitis.  I will give him Dr. Crist office information for him to follow-up on.  I urged his son to follow-up with Dr. Crist office regarding his leg.  He has not seen any Morse orthopedic doctor since August of last year.  I am not sure  how he recovered from osteomyelitis of the entire tibia.  He can follow-up with Dr. Jeffrie or APP in 3 months.    ROS:   He denies chest pain, palpitations, dyspnea, pnd, orthopnea, n, v, dizziness, syncope, edema, weight gain, or early satiety. All Morse systems reviewed and are otherwise negative except as noted above.    Studies Reviewed EKG Interpretation Date/Time:  Monday November 01 2023 10:12:38 EDT Ventricular Rate:  64 PR Interval:  248 QRS Duration:  136 QT Interval:  464 QTC  Calculation: 478 R Axis:   205  Text Interpretation: Sinus rhythm with 1st degree A-V block with Premature atrial complexes with Abberant conduction Right bundle branch block Septal infarct (cited on or before 01-Nov-2023) When compared with ECG of 16-Nov-2022 11:10, Abberant conduction is now Present Questionable change in initial forces of Septal leads Confirmed by Gary Morse [phone]) on 11/03/2023 11:08:14 PM    Cardiac Studies & Procedures   ______________________________________________________________________________________________     ECHOCARDIOGRAM  ECHOCARDIOGRAM COMPLETE 12/19/2021  Narrative ECHOCARDIOGRAM REPORT    Patient Name:   Gary Morse Date of Exam: 12/19/2021 Medical Rec #:  [id number]       Height:       68.0 in Accession #:    [id number]      Weight:       172.4 lb Date of Birth:  10-Sep-1934       BSA:          1.919 m Patient Age:    86 years        BP:           110/84 mmHg Patient Gender: M               HR:           56 bpm. Exam Location:  Inpatient  Procedure: 2D Echo, Cardiac Doppler, Color Doppler and Intracardiac Opacification Agent  Indications:    CHF-Acute Systolic I50.21  History:        Patient has prior history of Echocardiogram examinations, most recent 07/04/2019. CHF, COPD, Signs/Symptoms:Dyspnea; Risk Factors:Hypertension, Dyslipidemia and Diabetes.  Sonographer:    Gary Morse RDCS Referring Phys: [id number] Gary Morse  IMPRESSIONS   1. Global hypokinesis with severe hypokinesis of the inferolateral wall; overall moderate LV dysfunction. 2. Left ventricular ejection fraction, by estimation, is 35 to 40%. The left ventricle has moderately decreased function. The left ventricle demonstrates regional wall motion abnormalities (see scoring diagram/findings for description). The left ventricular internal cavity size was mildly dilated. Left ventricular diastolic parameters are consistent with Grade I diastolic dysfunction (impaired  relaxation). Elevated left atrial pressure. 3. Right ventricular systolic function is normal. The right ventricular size is normal. 4. Left atrial size was mildly dilated. 5. The mitral valve is normal in structure. Trivial mitral valve regurgitation. No evidence of mitral stenosis. 6. The aortic valve has an indeterminant number of cusps. Aortic valve regurgitation is trivial. No aortic stenosis is present. 7. Aortic dilatation noted. There is mild dilatation of the ascending aorta, measuring 40 mm. 8. The inferior vena cava is normal in size with greater than 50% respiratory variability, suggesting right atrial pressure of 3 mmHg.  FINDINGS Left Ventricle: Left ventricular ejection fraction, by estimation, is 35 to 40%. The left ventricle has moderately decreased function. The left ventricle demonstrates regional wall motion abnormalities. Definity  contrast agent was given IV to delineate the left ventricular endocardial borders. The left ventricular internal cavity size was mildly dilated. There is no left  ventricular hypertrophy. Left ventricular diastolic parameters are consistent with Grade I diastolic dysfunction (impaired relaxation). Elevated left atrial pressure.  Right Ventricle: The right ventricular size is normal. Right ventricular systolic function is normal.  Left Atrium: Left atrial size was mildly dilated.  Right Atrium: Right atrial size was normal in size.  Pericardium: There is no evidence of pericardial effusion.  Mitral Valve: The mitral valve is normal in structure. Trivial mitral valve regurgitation. No evidence of mitral valve stenosis.  Tricuspid Valve: The tricuspid valve is normal in structure. Tricuspid valve regurgitation is not demonstrated. No evidence of tricuspid stenosis.  Aortic Valve: The aortic valve has an indeterminant number of cusps. Aortic valve regurgitation is trivial. No aortic stenosis is present.  Pulmonic Valve: The pulmonic valve was not  well visualized. Pulmonic valve regurgitation is not visualized. No evidence of pulmonic stenosis.  Aorta: Aortic dilatation noted. There is mild dilatation of the ascending aorta, measuring 40 mm.  Venous: The inferior vena cava is normal in size with greater than 50% respiratory variability, suggesting right atrial pressure of 3 mmHg.  IAS/Shunts: No atrial level shunt detected by color flow Doppler.  Additional Comments: Global hypokinesis with severe hypokinesis of the inferolateral wall; overall moderate LV dysfunction.   LEFT VENTRICLE PLAX 2D LVIDd:         5.60 cm      Diastology LVIDs:         4.30 cm      LV e' medial:    3.16 cm/s LV PW:         1.30 cm      LV E/e' medial:  15.0 LV IVS:        1.00 cm      LV e' lateral:   3.78 cm/s LVOT diam:     2.20 cm      LV E/e' lateral: 12.5 LV SV:         43 LV SV Index:   22 LVOT Area:     3.80 cm  LV Volumes (MOD) LV vol d, MOD A2C: 151.0 ml LV vol d, MOD A4C: 170.0 ml LV vol s, MOD A2C: 100.0 ml LV vol s, MOD A4C: 106.0 ml LV SV MOD A2C:     51.0 ml LV SV MOD A4C:     170.0 ml LV SV MOD BP:      58.2 ml  RIGHT VENTRICLE RV S prime:     9.53 cm/s TAPSE (M-mode): 1.6 cm  LEFT ATRIUM             Index        RIGHT ATRIUM           Index LA diam:        4.60 cm 2.40 cm/m   RA Area:     17.30 cm LA Vol (A2C):   70.6 ml 36.79 ml/m  RA Volume:   44.10 ml  22.98 ml/m LA Vol (A4C):   79.0 ml 41.17 ml/m LA Biplane Vol: 74.8 ml 38.98 ml/m AORTIC VALVE LVOT Vmax:   48.20 cm/s LVOT Vmean:  30.100 cm/s LVOT VTI:    0.112 m  AORTA Ao Root diam: 3.70 cm Ao Asc diam:  4.00 cm  MITRAL VALVE MV Area (PHT): 2.93 cm    SHUNTS MV Decel Time: 259 msec    Systemic VTI:  0.11 m MV E velocity: 47.30 cm/s  Systemic Diam: 2.20 cm MV A velocity: 54.20 cm/s MV E/A ratio:  0.87  Redell  Crenshaw MD Electronically signed by Redell Shallow MD Signature Date/Time: 12/19/2021/4:50:55 PM    Final           ______________________________________________________________________________________________      Risk Assessment/Calculations          Physical Exam VS:  BP (!) 150/74   Pulse 85   Ht 5' 8 (1.727 m)   Wt 149 lb 9.6 oz (67.9 kg)   SpO2 96%   BMI 22.75 kg/m        Wt Readings from Last 3 Encounters:  11/01/23 149 lb 9.6 oz (67.9 kg)  11/16/22 137 lb 9.6 oz (62.4 kg)  12/21/21 169 lb 5 oz (76.8 kg)    GEN: Well nourished, well developed in no acute distress NECK: No JVD; No carotid bruits CARDIAC: RRR, no murmurs, rubs, gallops RESPIRATORY:  Clear to auscultation without rales, wheezing or rhonchi  ABDOMEN: Soft, non-tender, non-distended EXTREMITIES:  No edema; No deformity   ASSESSMENT AND PLAN  HFrEF: Continue carvedilol , Entresto  and spironolactone .  Blood pressure is elevated today as patient did not take his medication prior to office arrival.  I recommended repeat echocardiogram  Frequent PVCs: On amiodarone  therapy  Right lower extremity osteomyelitis: Previous imaging from last year indicated patient has significant osteomyelitis that involve the entire right tibia.  However he was lost to follow-up after that, surprisingly his right lower extremity appears to be healed although scarred.  I asked him to follow-up with orthopedic service        Dispo: Follow-up with Dr. Jeffrie or APP in 3 months.  Signed, Scot Ford, PA

## 2023-11-24 ENCOUNTER — Ambulatory Visit (HOSPITAL_COMMUNITY)
Admission: RE | Admit: 2023-11-24 | Discharge: 2023-11-24 | Disposition: A | Discharge status: Home / Self Care | Source: Ambulatory Visit | Attending: Cardiovascular Disease | Admitting: Cardiovascular Disease

## 2023-11-24 ENCOUNTER — Other Ambulatory Visit: Payer: Self-pay | Admitting: Physician Assistant

## 2023-11-24 DIAGNOSIS — I5022 Chronic systolic (congestive) heart failure: Secondary | ICD-10-CM | POA: Diagnosis not present

## 2023-11-24 LAB — ECHOCARDIOGRAM COMPLETE
AR max vel: 3.2 cm2
AV Area VTI: 3.01 cm2
AV Area mean vel: 2.97 cm2
AV Mean grad: 2 mmHg
AV Peak grad: 3.9 mmHg
Ao pk vel: 0.99 m/s
Area-P 1/2: 2.76 cm2
Est EF: 40
S' Lateral: 4.6 cm

## 2023-11-24 MED ORDER — PERFLUTREN LIPID MICROSPHERE
1.0000 mL | INTRAVENOUS | Status: AC | PRN
  Administered 2023-11-24: 4 mL via INTRAVENOUS

## 2023-11-29 ENCOUNTER — Ambulatory Visit: Payer: Self-pay | Admitting: Physician Assistant

## 2023-12-23 ENCOUNTER — Other Ambulatory Visit: Payer: Self-pay | Admitting: Physician Assistant

## 2024-02-01 ENCOUNTER — Ambulatory Visit: Attending: Cardiology | Admitting: Cardiology

## 2024-02-01 ENCOUNTER — Encounter: Payer: Self-pay | Admitting: Cardiology

## 2024-02-01 VITALS — BP 138/77 | HR 66 | Ht 68.0 in | Wt 148.0 lb

## 2024-02-01 DIAGNOSIS — I493 Ventricular premature depolarization: Secondary | ICD-10-CM | POA: Diagnosis not present

## 2024-02-01 DIAGNOSIS — I77810 Thoracic aortic ectasia: Secondary | ICD-10-CM | POA: Diagnosis not present

## 2024-02-01 DIAGNOSIS — M869 Osteomyelitis, unspecified: Secondary | ICD-10-CM

## 2024-02-01 DIAGNOSIS — N1832 Chronic kidney disease, stage 3b: Secondary | ICD-10-CM | POA: Diagnosis not present

## 2024-02-01 DIAGNOSIS — Z79899 Other long term (current) drug therapy: Secondary | ICD-10-CM | POA: Diagnosis not present

## 2024-02-01 DIAGNOSIS — I452 Bifascicular block: Secondary | ICD-10-CM | POA: Diagnosis not present

## 2024-02-01 DIAGNOSIS — I5022 Chronic systolic (congestive) heart failure: Secondary | ICD-10-CM | POA: Diagnosis not present

## 2024-02-01 LAB — CBC

## 2024-02-01 NOTE — Progress Notes (Signed)
 Cardiology Office Note:  .   Date:  02/01/2024  ID:  Gary Morse, DOB 06/18/1934, MRN [id number] PCP: Ransom Other, MD  [facility] Providers Cardiologist:  Oneil Parchment, MD Electrophysiologist:  Danelle Birmingham, MD     History of Present Illness: .   Gary Morse is a 89 y.o. male Discussed the use of AI scribe   History of Present Illness Gary Morse is an 89 year old male with chronic systolic heart failure and frequent PVCs who presents for follow-up.  He has chronic systolic heart failure with an ejection fraction that initially measured 25-30% and improved to 35-40% as of August 2023. He is on carvedilol  25 mg twice daily, Entresto  49/51 mg twice daily, spironolactone  12.5 mg daily, and furosemide  20 mg daily. No chest pain is reported, and he states, 'I've been great.'  He experiences frequent premature ventricular contractions with a burden of 18% as shown on a heart monitor. He is currently on amiodarone  100 mg daily for PVCs.  He has a history of supraventricular tachycardia and premature atrial contractions. A prior EKG showed sinus rhythm with a first-degree AV block and a heart rate of 64 beats per minute, with a PR interval of 248 milliseconds and aberrant conduction.  He has chronic kidney disease stage 3B with a stable creatinine level of 1.56 as of July 2024.  He has diabetes with a hemoglobin A1c of 6.1. He is on Jardiance 10 mg daily.  He has hyperlipidemia with an LDL cholesterol level of 54. He is on simvastatin  10 mg daily.  He has a history of a staph aureus abscess in the leg, which was treated successfully, and he expresses gratitude for saving his leg.  He reports difficulty hearing and mentions needing a hearing aid appointment, stating 'I can't hear well.'  No chest pain.     Studies Reviewed: .        Results LABS Creatinine: 1.56 (10/2022) Hemoglobin: 12.7 (10/2022) Hemoglobin A1c: 6.0 (10/2022) LDL: 54 Creatinine: 1.5 ALT:  6 (08/2023) Thyroid  function: 4.4 (08/2023)  DIAGNOSTIC Ejection Fraction: 35-40% (11/2021) EKG: Sinus rhythm, first degree AV block, heart rate 64 bpm, PR interval 248 ms, aberrant conduction Risk Assessment/Calculations:            Physical Exam:   VS:  BP 138/77   Pulse 66   Ht 5' 8 (1.727 m)   Wt 148 lb (67.1 kg)   SpO2 97%   BMI 22.50 kg/m    Wt Readings from Last 3 Encounters:  02/01/24 148 lb (67.1 kg)  11/01/23 149 lb 9.6 oz (67.9 kg)  11/16/22 137 lb 9.6 oz (62.4 kg)    GEN: Thin, well developed in no acute distress NECK: No JVD; No carotid bruits CARDIAC: RRR, no murmurs, no rubs, no gallops RESPIRATORY:  Clear to auscultation without rales, wheezing or rhonchi  ABDOMEN: Soft, non-tender, non-distended EXTREMITIES:  No edema; No deformity   ASSESSMENT AND PLAN: .    Assessment and Plan Assessment & Plan Chronic systolic heart failure with reduced ejection fraction Chronic systolic heart failure with an ejection fraction of 35-40% as of the last echocardiogram in August 2023. Currently asymptomatic with no chest pain reported. 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 Currently managed with amiodarone  100 mg daily. No  new symptoms reported, and he is tolerating the medication well. - Continue amiodarone  100 mg daily - Check liver and thyroid  function tests to ensure safety of amiodarone  therapy  Chronic kidney disease stage 3b Chronic kidney disease stage 3b with a creatinine level of approximately 1.5. Kidney function is slightly sluggish but not severe. - Advise avoiding NSAIDs such as Advil and ibuprofen  Type 2 diabetes mellitus, well controlled Type 2 diabetes mellitus with excellent control, as indicated by a hemoglobin A1c  of 6.1. - Continue Jardiance 10 mg daily  Hyperlipidemia, well controlled Hyperlipidemia is well controlled with an LDL cholesterol level of 54. - Continue simvastatin  10 mg daily  Osteomyelitis - Dr. Harden, leg well-healed.         Dispo: 1 yr  Signed, Oneil Parchment, MD

## 2024-02-01 NOTE — Patient Instructions (Signed)
 Medication Instructions:  The current medical regimen is effective;  continue present plan and medications.  *If you need a refill on your cardiac medications before your next appointment, please call your pharmacy*  Lab Work: Please have blood work today at [facility] on the 1st floor.  If you have labs (blood work) drawn today and your tests are completely normal, you will receive your results only by: MyChart Message (if you have MyChart) OR A paper copy in the mail If you have any lab test that is abnormal or we need to change your treatment, we will call you to review the results.  Follow-Up: At [facility], you and your health needs are our priority.  As part of our continuing mission to provide you with exceptional heart care, our providers are all part of one team.  This team includes your primary Cardiologist (physician) and Advanced Practice Providers or APPs (Physician Assistants and Nurse Practitioners) who all work together to provide you with the care you need, when you need it.  Your next appointment:   1 year(s)  Provider:   Oneil Parchment, MD    We recommend signing up for the patient portal called MyChart.  Sign up information is provided on this After Visit Summary.  MyChart is used to connect with patients for Virtual Visits (Telemedicine).  Patients are able to view lab/test results, encounter notes, upcoming appointments, etc.  Non-urgent messages can be sent to your provider as well.   To learn more about what you can do with MyChart, go to [email or web address].

## 2024-02-02 LAB — COMPREHENSIVE METABOLIC PANEL WITH GFR
ALT: 7 IU/L (ref 0–44)
AST: 9 IU/L (ref 0–40)
Albumin: 3.7 g/dL (ref 3.7–4.7)
Alkaline Phosphatase: 91 IU/L (ref 48–129)
BUN/Creatinine Ratio: 12 (ref 10–24)
BUN: 16 mg/dL (ref 8–27)
Bilirubin Total: 0.3 mg/dL (ref 0.0–1.2)
CO2: 24 mmol/L (ref 20–29)
Calcium: 9 mg/dL (ref 8.6–10.2)
Chloride: 104 mmol/L (ref 96–106)
Creatinine, Ser: 1.3 mg/dL — ABNORMAL HIGH (ref 0.76–1.27)
Globulin, Total: 3.4 g/dL (ref 1.5–4.5)
Glucose: 97 mg/dL (ref 70–99)
Potassium: 4.2 mmol/L (ref 3.5–5.2)
Sodium: 142 mmol/L (ref 134–144)
Total Protein: 7.1 g/dL (ref 6.0–8.5)
eGFR: 53 mL/min/1.73 — ABNORMAL LOW (ref >59)

## 2024-02-02 LAB — CBC
Hematocrit: 46 % (ref 37.5–51.0)
Hemoglobin: 14.5 g/dL (ref 13.0–17.7)
MCH: 27.1 pg (ref 26.6–33.0)
MCHC: 31.5 g/dL (ref 31.5–35.7)
MCV: 86 fL (ref 79–97)
Platelets: 394 x10E3/uL (ref 150–450)
RBC: 5.36 x10E6/uL (ref 4.14–5.80)
RDW: 14.7 % (ref 11.6–15.4)
WBC: 6.1 x10E3/uL (ref 3.4–10.8)

## 2024-02-02 LAB — TSH: TSH: 4.93 u[IU]/mL — ABNORMAL HIGH (ref 0.450–4.500)

## 2024-02-04 ENCOUNTER — Ambulatory Visit: Payer: Self-pay | Admitting: Cardiology
